# Patient Record
Sex: Male | Born: 1990 | Race: White | Hispanic: No | Marital: Married | Smoking: Former smoker
Health system: Southern US, Community
[De-identification: ages and names within clinical notes are randomized; demographics above are authoritative.]

## PROBLEM LIST (undated history)

## (undated) DIAGNOSIS — K219 Gastro-esophageal reflux disease without esophagitis: Secondary | ICD-10-CM

## (undated) DIAGNOSIS — B019 Varicella without complication: Secondary | ICD-10-CM

## (undated) HISTORY — DX: Varicella without complication: B01.9

## (undated) HISTORY — PX: NO PAST SURGERIES: SHX2092

## (undated) HISTORY — DX: Gastro-esophageal reflux disease without esophagitis: K21.9

---
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
## 2011-09-08 ENCOUNTER — Emergency Department (HOSPITAL_BASED_OUTPATIENT_CLINIC_OR_DEPARTMENT_OTHER)
Admission: EM | Admit: 2011-09-08 | Discharge: 2011-09-08 | Disposition: A | Discharge status: Home / Self Care | Payer: BC Managed Care – PPO | Attending: Emergency Medicine | Admitting: Emergency Medicine

## 2011-09-08 ENCOUNTER — Encounter: Payer: Self-pay | Admitting: *Deleted

## 2011-09-08 DIAGNOSIS — R21 Rash and other nonspecific skin eruption: Secondary | ICD-10-CM | POA: Insufficient documentation

## 2011-09-08 MED ORDER — PREDNISONE 20 MG PO TABS: 40.0000 mg | ORAL_TABLET | Freq: Every day | ORAL | Status: AC

## 2011-09-08 NOTE — ED Notes (Signed)
C/o rash to face, neck, and left eye since earlier today. No redness or hives noted at this time.

## 2011-09-08 NOTE — ED Provider Notes (Signed)
History     CSN: [id number] Arrival date & time: 09/08/2011  8:47 PM   First MD Initiated Contact with Patient 09/08/11 2100      Chief Complaint  Patient presents with  . Rash    (Consider location/radiation/quality/duration/timing/severity/associated sxs/prior treatment) HPI Comments: Pt states that he was cutting down trees and the symptoms started yesterday:pt states that he felt like his mouth was itchy so want to come in to be seen  Patient is a 20 y.o. male presenting with rash. The history is provided by the patient. No language interpreter was used.  Rash  This is a new problem. The current episode started 12 to 24 hours ago. The problem is associated with plant contact. There has been no fever. The rash is present on the face. The patient is experiencing no pain. Associated symptoms include itching. Pertinent negatives include no weeping. He has tried nothing for the symptoms.    History reviewed. No pertinent past medical history.  History reviewed. No pertinent past surgical history.  History reviewed. No pertinent family history.  History  Substance Use Topics  . Smoking status: Never Smoker   . Smokeless tobacco: Not on file  . Alcohol Use: No      Review of Systems  Constitutional: Negative.   Eyes: Negative.   Respiratory: Negative.   Cardiovascular: Negative.   Skin: Positive for itching and rash.  Neurological: Negative.     Allergies  Review of patient's allergies indicates no known allergies.  Home Medications  No current outpatient prescriptions on file.  BP 128/77  Pulse 81  Temp(Src) 98.5 F (36.9 C) (Oral)  Resp 16  Ht 5\' 8"  (1.727 m)  Wt 160 lb (72.576 kg)  BMI 24.33 kg/m2  SpO2 99%  Physical Exam  Nursing note and vitals reviewed. Constitutional: He is oriented to person, place, and time. He appears well-developed and well-nourished.  HENT:  Head: Normocephalic and atraumatic.  Right Ear: External ear normal.  Mouth/Throat:  Oropharynx is clear and moist.  Cardiovascular: Normal rate.   Pulmonary/Chest: Effort normal and breath sounds normal.  Neurological: He is alert and oriented to person, place, and time.  Skin:       Pt has a localized area of redness to the left forehead:no vesicles or drainage noted to the area    ED Course  Procedures (including critical care time)  Labs Reviewed - No data to display No results found.   1. Rash       MDM  Localized area of redness:will do a short course of steriods    Medical screening examination/treatment/procedure(s) were performed by non-physician practitioner and as supervising physician I was immediately available for consultation/collaboration. Osvaldo Human, M.D.    Teressa Lower, NP 09/08/11 2125  Carleene Cooper III, MD 09/09/11 [phone]

## 2012-01-28 ENCOUNTER — Emergency Department (INDEPENDENT_AMBULATORY_CARE_PROVIDER_SITE_OTHER): Payer: BC Managed Care – PPO

## 2012-01-28 ENCOUNTER — Encounter (HOSPITAL_BASED_OUTPATIENT_CLINIC_OR_DEPARTMENT_OTHER): Payer: Self-pay | Admitting: *Deleted

## 2012-01-28 ENCOUNTER — Emergency Department (HOSPITAL_BASED_OUTPATIENT_CLINIC_OR_DEPARTMENT_OTHER)
Admission: EM | Admit: 2012-01-28 | Discharge: 2012-01-28 | Disposition: A | Discharge status: Home / Self Care | Payer: BC Managed Care – PPO | Attending: Emergency Medicine | Admitting: Emergency Medicine

## 2012-01-28 DIAGNOSIS — R0602 Shortness of breath: Secondary | ICD-10-CM

## 2012-01-28 DIAGNOSIS — J45909 Unspecified asthma, uncomplicated: Secondary | ICD-10-CM | POA: Insufficient documentation

## 2012-01-28 DIAGNOSIS — J40 Bronchitis, not specified as acute or chronic: Secondary | ICD-10-CM | POA: Insufficient documentation

## 2012-01-28 MED ORDER — ALBUTEROL SULFATE HFA 108 (90 BASE) MCG/ACT IN AERS: 1.0000 | INHALATION_SPRAY | Freq: Four times a day (QID) | RESPIRATORY_TRACT | Status: DC | PRN

## 2012-01-28 MED ORDER — PREDNISONE 10 MG PO TABS: 20.0000 mg | ORAL_TABLET | Freq: Every day | ORAL | Status: DC

## 2012-01-28 NOTE — ED Notes (Signed)
Pt reports intermittent episodes of SOB- states "hard to get a full breath"- states was SOB last night and not feeling better today

## 2012-01-28 NOTE — ED Notes (Signed)
Patient called to say that his pharmacy was closed at this time and requested his prescriptions be called in to [facility] on [address] in [place].

## 2012-01-28 NOTE — ED Provider Notes (Signed)
History  This chart was scribed for Toy Baker, MD by Bennett Scrape. This patient was seen in room MH10/MH10 and the patient's care was started at 7:13PM.  CSN: [id number]  Arrival date & time 01/28/12  1755   First MD Initiated Contact with Patient 01/28/12 1911      Chief Complaint  Patient presents with  . Shortness of Breath    The history is provided by the patient. No language interpreter was used.    Logan Gardner is a 21 y.o. male with a h/o asthma who presents to the Emergency Department complaining of 24 hours of gradual onset, non-changing, intermittent SOB with occasional wheezing, shakiness and feeling sweaty. Pt describes the SOB as not being able to take a deep breath. He denies any modifying factors. He reports that he has been experiencing episodes of similar symptoms for months. He states that the SOB symptoms happen only at night and resolve on their own after an hour or two. Pt states that he came into this ED to be evaluated, because this episode has continued on since last night. He reports that he woke up this morning still not feeling well but continued onto work anyway. He states that he stopped at a [employer] after work when the symptoms became worse. He has not taken any medication PTA to improve symptoms. 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 He denies cough, chest and abdominal pain as associated symptoms. He is a former smoker (quit 01/21/12 after one year of use) and occasional alcohol user.  Past Medical History  Diagnosis Date  . Asthma     History reviewed. No pertinent past surgical history.  No family history on file.  History  Substance Use Topics  . Smoking status: Former Smoker    Quit date: 01/21/2012  . Smokeless tobacco: Current User    Types: Snuff  . Alcohol Use: No     rare      Review of Systems  Constitutional: Negative for fever and chills.  HENT:  Negative for congestion, sore throat and neck pain.   Eyes: Negative for pain.  Respiratory: Positive for shortness of breath and wheezing (Occasionally  ). Negative for cough.   Cardiovascular: Negative for chest pain.  Gastrointestinal: Negative for nausea, vomiting, abdominal pain and diarrhea.  Genitourinary: Negative for dysuria, urgency and hematuria.  Musculoskeletal: Negative for back pain.  Skin: Negative for rash.  Neurological: Negative for seizures and headaches.  Psychiatric/Behavioral: Negative for confusion.    Allergies  Review of patient's allergies indicates no known allergies.  Home Medications  No current outpatient prescriptions on file.  Triage Vitals: BP 137/65  Pulse 100  Temp(Src) 98.2 F (36.8 C) (Oral)  Resp 20  Ht 5\' 9"  (1.753 m)  Wt 155 lb (70.308 kg)  BMI 22.89 kg/m2  SpO2 100%  Physical Exam  Nursing note and vitals reviewed. Constitutional: He is oriented to person, place, and time. He appears well-developed and well-nourished.  HENT:  Head: Normocephalic and atraumatic.  Eyes: Conjunctivae and EOM are normal.  Neck: Normal range of motion. Neck supple.  Cardiovascular: Normal rate, regular rhythm and normal heart sounds.   Pulmonary/Chest: Effort normal and breath sounds normal. No respiratory distress. He has no wheezes. He has no rales.  Abdominal: Soft. There is no tenderness.  Musculoskeletal: Normal range of motion. He exhibits no edema.  Neurological: He is alert and oriented to person, place, and  time. No cranial nerve deficit.  Skin: Skin is warm and dry.  Psychiatric: He has a normal mood and affect. His behavior is normal.    ED Course  Procedures (including critical care time)  DIAGNOSTIC STUDIES: Oxygen Saturation is 100% on room air, normal by my interpretation.    COORDINATION OF CARE: 7:16PM-Discussed chest x-ray order and treatment plan with pt and pt agreed to plan.    Labs Reviewed - No data to display  Dg  Chest 2 View  01/28/2012  *RADIOLOGY REPORT*  Clinical Data: Short of breath.  Asthma.  CHEST - 2 VIEW  Comparison:  11/14/2008  Findings:  The heart size and mediastinal contours are within normal limits.  Both lungs are clear.  Pulmonary hyperinflation is seen, consistent with history of asthma/COPD.  The visualized skeletal structures are unremarkable.  IMPRESSION: Pulmonary hyperinflation, consistent with asthma/COPD.  No active disease.  Original Report Authenticated By: Danae Orleans, M.D.     No diagnosis found.    MDM  Patient to be treated for bronchitis and discharged. No concern for pulmonary embolism as patient has perked negative      I personally performed the services described in this documentation, which was scribed in my presence. The recorded information has been reviewed and considered.     Toy Baker, MD 01/28/12 2011

## 2012-01-28 NOTE — Discharge Instructions (Signed)
 Bronchitis Bronchitis is the body's way of reacting to injury and/or infection (inflammation) of the bronchi. Bronchi are the air tubes that extend from the windpipe into the lungs. If the inflammation becomes severe, it may cause shortness of breath. CAUSES  Inflammation may be caused by:  A virus.   Germs (bacteria).   Dust.   Allergens.   Pollutants and many other irritants.  The cells lining the bronchial tree are covered with tiny hairs (cilia). These constantly beat upward, away from the lungs, toward the mouth. This keeps the lungs free of pollutants. When these cells become too irritated and are unable to do their job, mucus begins to develop. This causes the characteristic cough of bronchitis. The cough clears the lungs when the cilia are unable to do their job. Without either of these protective mechanisms, the mucus would settle in the lungs. Then you would develop pneumonia. Smoking is a common cause of bronchitis and can contribute to pneumonia. Stopping this habit is the single most important thing you can do to help yourself. TREATMENT   Your caregiver may prescribe an antibiotic if the cough is caused by bacteria. Also, medicines that open up your airways make it easier to breathe. Your caregiver may also recommend or prescribe an expectorant. It will loosen the mucus to be coughed up. Only take over-the-counter or prescription medicines for pain, discomfort, or fever as directed by your caregiver.   Removing whatever causes the problem (smoking, for example) is critical to preventing the problem from getting worse.   Cough suppressants may be prescribed for relief of cough symptoms.   Inhaled medicines may be prescribed to help with symptoms now and to help prevent problems from returning.   For those with recurrent (chronic) bronchitis, there may be a need for steroid medicines.  SEEK IMMEDIATE MEDICAL CARE IF:   During treatment, you develop more pus-like mucus  (purulent sputum).   You have a fever.   Your baby is older than 3 months with a rectal temperature of 102 F (38.9 C) or higher.   Your baby is 51 months old or younger with a rectal temperature of 100.4 F (38 C) or higher.   You become progressively more ill.   You have increased difficulty breathing, wheezing, or shortness of breath.  It is necessary to seek immediate medical care if you are elderly or sick from any other disease. MAKE SURE YOU:   Understand these instructions.   Will watch your condition.   Will get help right away if you are not doing well or get worse.  Document Released: 10/24/2005 Document Revised: 10/13/2011 Document Reviewed: 09/02/2008 [facility] Patient Information 2012 [place], [place].

## 2013-04-28 ENCOUNTER — Emergency Department (HOSPITAL_BASED_OUTPATIENT_CLINIC_OR_DEPARTMENT_OTHER)
Admission: EM | Admit: 2013-04-28 | Discharge: 2013-04-29 | Disposition: A | Discharge status: Home / Self Care | Payer: BC Managed Care – PPO | Attending: Emergency Medicine | Admitting: Emergency Medicine

## 2013-04-28 ENCOUNTER — Emergency Department (HOSPITAL_BASED_OUTPATIENT_CLINIC_OR_DEPARTMENT_OTHER): Payer: BC Managed Care – PPO

## 2013-04-28 ENCOUNTER — Encounter (HOSPITAL_BASED_OUTPATIENT_CLINIC_OR_DEPARTMENT_OTHER): Payer: Self-pay | Admitting: *Deleted

## 2013-04-28 DIAGNOSIS — J45909 Unspecified asthma, uncomplicated: Secondary | ICD-10-CM | POA: Insufficient documentation

## 2013-04-28 DIAGNOSIS — Z87891 Personal history of nicotine dependence: Secondary | ICD-10-CM | POA: Insufficient documentation

## 2013-04-28 DIAGNOSIS — J3489 Other specified disorders of nose and nasal sinuses: Secondary | ICD-10-CM | POA: Insufficient documentation

## 2013-04-28 DIAGNOSIS — J40 Bronchitis, not specified as acute or chronic: Secondary | ICD-10-CM

## 2013-04-28 DIAGNOSIS — Z79899 Other long term (current) drug therapy: Secondary | ICD-10-CM | POA: Insufficient documentation

## 2013-04-28 NOTE — ED Notes (Addendum)
C/o prod cough that started yesterday. States green in color. Has had a low grader fever today. Last dose of tylenol at 330pm. States he worked outside with hay/ dust  yesterday and has felt worse since then. States his lungs "feel congested"  Also states he has some general weakness. C/o nausea as well. Denies vomiting. No wheezing noted on exam. Recent dz of asthma. Denies sob

## 2013-04-28 NOTE — ED Provider Notes (Signed)
History     CSN: [id number]  Arrival date & time 04/28/13  2125   None     Chief Complaint  Patient presents with  . Cough    (Consider location/radiation/quality/duration/timing/severity/associated sxs/prior treatment) Patient is a 22 y.o. male presenting with cough. The history is provided by the patient. No language interpreter was used.  Cough Cough characteristics:  Productive Sputum characteristics:  Nondescript Severity:  Moderate Onset quality:  Gradual Timing:  Constant Progression:  Worsening Chronicity:  New Smoker: no   Context: exposure to allergens   Relieved by:  Nothing Worsened by:  Nothing tried Ineffective treatments:  None tried Associated symptoms: rhinorrhea     Past Medical History  Diagnosis Date  . Asthma     History reviewed. No pertinent past surgical history.  No family history on file.  History  Substance Use Topics  . Smoking status: Former Smoker    Quit date: 01/21/2012  . Smokeless tobacco: Current User    Types: Snuff  . Alcohol Use: No     Comment: rare      Review of Systems  HENT: Positive for rhinorrhea.   Respiratory: Positive for cough.   All other systems reviewed and are negative.    Allergies  Review of patient's allergies indicates no known allergies.  Home Medications   Current Outpatient Rx  Name  Route  Sig  Dispense  Refill  . ALBUTEROL IN   Inhalation   Inhale into the lungs.         . predniSONE (DELTASONE) 10 MG tablet   Oral   Take 2 tablets (20 mg total) by mouth daily.   10 tablet   0     BP 129/79  Pulse 78  Temp(Src) 99.4 F (37.4 C) (Oral)  SpO2 98%  Physical Exam  Nursing note and vitals reviewed. Constitutional: He is oriented to person, place, and time. He appears well-developed and well-nourished.  HENT:  Head: Normocephalic and atraumatic.  Nose: Nose normal.  Mouth/Throat: Oropharynx is clear and moist.  Eyes: Conjunctivae and EOM are normal. Pupils are equal,  round, and reactive to light.  Neck: Normal range of motion. Neck supple.  Cardiovascular: Normal rate and normal heart sounds.   Pulmonary/Chest: Effort normal and breath sounds normal.  Abdominal: Soft.  Musculoskeletal: Normal range of motion.  Neurological: He is alert and oriented to person, place, and time. He has normal reflexes.  Skin: Skin is warm.  Psychiatric: He has a normal mood and affect.    ED Course  Procedures (including critical care time)  Labs Reviewed - No data to display No results found.   1. Bronchitis       MDM  zithromax  And albuterol   Return if any problems.          Lonia [place] Mc[place]ray, PA-C 04/29/13 0013

## 2013-04-29 MED ORDER — ALBUTEROL SULFATE HFA 108 (90 BASE) MCG/ACT IN AERS: 1.0000 | INHALATION_SPRAY | Freq: Four times a day (QID) | RESPIRATORY_TRACT | Status: DC | PRN

## 2013-04-29 MED ORDER — AZITHROMYCIN 100 MG/5ML PO SUSR: 100.0000 mg | Freq: Every day | ORAL | Status: AC

## 2013-04-29 NOTE — ED Provider Notes (Signed)
Medical screening examination/treatment/procedure(s) were performed by non-physician practitioner and as supervising physician I was immediately available for consultation/collaboration.   Hanley Seamen, MD 04/29/13 [phone]

## 2014-01-13 ENCOUNTER — Emergency Department (HOSPITAL_BASED_OUTPATIENT_CLINIC_OR_DEPARTMENT_OTHER)
Admission: EM | Admit: 2014-01-13 | Discharge: 2014-01-13 | Disposition: A | Discharge status: Home / Self Care | Payer: BC Managed Care – PPO | Attending: Emergency Medicine | Admitting: Emergency Medicine

## 2014-01-13 ENCOUNTER — Encounter (HOSPITAL_BASED_OUTPATIENT_CLINIC_OR_DEPARTMENT_OTHER): Payer: Self-pay | Admitting: Emergency Medicine

## 2014-01-13 ENCOUNTER — Emergency Department (HOSPITAL_BASED_OUTPATIENT_CLINIC_OR_DEPARTMENT_OTHER): Payer: BC Managed Care – PPO

## 2014-01-13 DIAGNOSIS — IMO0002 Reserved for concepts with insufficient information to code with codable children: Secondary | ICD-10-CM | POA: Insufficient documentation

## 2014-01-13 DIAGNOSIS — Z87891 Personal history of nicotine dependence: Secondary | ICD-10-CM | POA: Insufficient documentation

## 2014-01-13 DIAGNOSIS — Z79899 Other long term (current) drug therapy: Secondary | ICD-10-CM | POA: Insufficient documentation

## 2014-01-13 DIAGNOSIS — J3489 Other specified disorders of nose and nasal sinuses: Secondary | ICD-10-CM | POA: Insufficient documentation

## 2014-01-13 DIAGNOSIS — Z9109 Other allergy status, other than to drugs and biological substances: Secondary | ICD-10-CM

## 2014-01-13 DIAGNOSIS — J45909 Unspecified asthma, uncomplicated: Secondary | ICD-10-CM | POA: Insufficient documentation

## 2014-01-13 MED ORDER — CETIRIZINE HCL 10 MG PO TABS: 10.0000 mg | ORAL_TABLET | Freq: Every day | ORAL | Status: DC

## 2014-01-13 MED ORDER — ALBUTEROL SULFATE HFA 108 (90 BASE) MCG/ACT IN AERS: 1.0000 | INHALATION_SPRAY | Freq: Four times a day (QID) | RESPIRATORY_TRACT | Status: DC | PRN

## 2014-01-13 NOTE — Discharge Instructions (Signed)
Allergies ° Allergies may happen from anything your body is sensitive to. This may be food, medicines, pollens, chemicals, and many other things. Food allergies can be severe and deadly.  °HOME CARE °· If you do not know what causes a reaction, keep a diary. Write down the foods you ate and the symptoms that followed. Avoid foods that cause reactions. °· If you have red raised spots (hives) or a rash: °· Take medicine as told by your doctor. °· Use medicines for red raised spots and itching as needed. °· Apply cold cloths (compresses) to the skin. Take a cool bath. Avoid hot baths or showers. °· If you are severely allergic: °· It is often necessary to go to the hospital after you have treated your reaction. °· Wear your medical alert jewelry. °· You and your family must learn how to give a allergy shot or use an allergy kit (anaphylaxis kit). °· Always carry your allergy kit or shot with you. Use this medicine as told by your doctor if a severe reaction is occurring. °GET HELP RIGHT AWAY IF: °· You have trouble breathing or are making high-pitched whistling sounds (wheezing). °· You have a tight feeling in your chest or throat. °· You have a puffy (swollen) mouth. °· You have red raised spots, puffiness (swelling), or itching all over your body. °· You have had a severe reaction that was helped by your allergy kit or shot. The reaction can return once the medicine has worn off. °· You think you are having a food allergy. Symptoms most often happen within 30 minutes of eating a food. °· Your symptoms have not gone away within 2 days or are getting worse. °· You have new symptoms. °· You want to retest yourself with a food or drink you think causes an allergic reaction. Only do this under the care of a doctor. °MAKE SURE YOU:  °· Understand these instructions. °· Will watch your condition. °· Will get help right away if you are not doing well or get worse. °Document Released: 02/18/2013 Document Reviewed:  02/18/2013 °[employer]® Patient Information ©2014 [employer]. ° °

## 2014-01-13 NOTE — ED Notes (Signed)
Pt reports moving into a new house and using a lot of cleaning supplies and since then has been having difficulty breathing, shortness of breath and coughing.  Pt reports coughing keeping him up all night.

## 2014-01-13 NOTE — ED Provider Notes (Signed)
CSN: [id number]     Arrival date & time 01/13/14  2321 History  This chart was scribed for Harla Mensch Smitty CordsK Havish Petties-Rasch, MD by Dorothey Basemania Sutton, ED Scribe. This patient was seen in room MH12/MH12 and the patient's care was started at 11:32 PM.    Chief Complaint  Patient presents with  . Shortness of Breath   Patient is a 23 y.o. male presenting with shortness of breath. The history is provided by the patient. No language interpreter was used.  Shortness of Breath Severity:  Moderate Onset quality:  Gradual Timing:  Intermittent Progression:  Unchanged Chronicity:  Recurrent Context: fumes, occupational exposure and pollens   Relieved by:  None tried Worsened by:  Coughing Ineffective treatments:  None tried Associated symptoms: cough   Associated symptoms: no fever, no sore throat and no sputum production   Cough:    Cough characteristics:  Dry   Severity:  Moderate   Onset quality:  Gradual   Timing:  Intermittent   Progression:  Unchanged   Chronicity:  Recurrent Risk factors: no hx of cancer, no hx of PE/DVT and no obesity    HPI Comments: Logan Gardner is a 23 y.o. Male with a history of asthma who presents to the Emergency Department complaining of shortness of breath with an associated dry cough and chest congestion onset about a week ago that he states has been progressively worsening. He states that the shortness of breath is exacerbated with coughing. Patient reports that he has been using a lot of cleaning supplies in his house recently and that he works in [employer]. He denies taking any medications at home to treat his symptoms and states that he has not used his albuterol inhaler because it is one year old and it may be expired. Patient has no other pertinent medical history.   Past Medical History  Diagnosis Date  . Asthma    No past surgical history on file. No family history on file. History  Substance Use Topics  . Smoking status: Former Smoker    Quit date:  01/21/2012  . Smokeless tobacco: Current User    Types: Snuff  . Alcohol Use: No     Comment: rare    Review of Systems  Constitutional: Negative for fever.  HENT: Positive for congestion. Negative for sore throat.   Respiratory: Positive for cough. Negative for sputum production.   All other systems reviewed and are negative.   Allergies  Review of patient's allergies indicates no known allergies.  Home Medications   Current Outpatient Rx  Name  Route  Sig  Dispense  Refill  . EXPIRED: albuterol (PROVENTIL HFA;VENTOLIN HFA) 108 (90 BASE) MCG/ACT inhaler   Inhalation   Inhale 1-2 puffs into the lungs every 6 (six) hours as needed for wheezing.   1 Inhaler   0   . albuterol (PROVENTIL HFA;VENTOLIN HFA) 108 (90 BASE) MCG/ACT inhaler   Inhalation   Inhale 1-2 puffs into the lungs every 6 (six) hours as needed for wheezing.   1 Inhaler   0   . ALBUTEROL IN   Inhalation   Inhale into the lungs.         . predniSONE (DELTASONE) 10 MG tablet   Oral   Take 2 tablets (20 mg total) by mouth daily.   10 tablet   0    Triage Vitals: BP 134/62  Pulse 92  Temp(Src) 98.5 F (36.9 C) (Oral)  Resp 18  Ht 5\' 10"  (1.778 m)  Wt 160  lb (72.576 kg)  BMI 22.96 kg/m2  SpO2 98%  Physical Exam  Nursing note and vitals reviewed. Constitutional: He is oriented to person, place, and time. He appears well-developed and well-nourished. No distress.  HENT:  Head: Normocephalic and atraumatic.  Mouth/Throat: Oropharynx is clear and moist.  No swelling of lips, tongue, or uvula.   Eyes: Conjunctivae and EOM are normal. Pupils are equal, round, and reactive to light.  Neck: Normal range of motion. Neck supple. No tracheal deviation present.  Cardiovascular: Normal rate, regular rhythm and normal heart sounds.   Pulmonary/Chest: Effort normal and breath sounds normal. No stridor. No respiratory distress. He has no wheezes. He has no rales.  Abdominal: Soft. Bowel sounds are normal. He  exhibits no distension. There is no tenderness. There is no rebound and no guarding.  Musculoskeletal: Normal range of motion.  Neurological: He is alert and oriented to person, place, and time.  Skin: Skin is warm and dry.  Psychiatric: He has a normal mood and affect. His behavior is normal.    ED Course  Procedures (including critical care time)  DIAGNOSTIC STUDIES: Oxygen Saturation is 98% on room air, normal by my interpretation.    COORDINATION OF CARE: 11:34 PM- Discussed that symptoms are likely allergic in nature and advised patient to take Zyrtec daily and Mucinex at home. Will discharge patient with a refill of his inhaler. Will cancel the chest x-ray order based on physical examination findings. Discussed treatment plan with patient at bedside and patient verbalized agreement.     Labs Review Labs Reviewed - No data to display Imaging Review No results found.   EKG Interpretation None      MDM   Final diagnoses:  None   Discussed symptomatic care at length. Wells = 0, PERC = 0. Patient symptoms consistent with allergies with superimposed anxiety. Will refill patient's inhaler and discharge patient with a prescription for Zyrtec. Advised patient to follow up with the referral at the allergy center as needed. Patient and significant other agreeable to plan.     I personally performed the services described in this documentation, which was scribed in my presence. The recorded information has been reviewed and is accurate.      Jasmine Awe, MD 01/14/14 [phone]

## 2016-04-19 ENCOUNTER — Ambulatory Visit (INDEPENDENT_AMBULATORY_CARE_PROVIDER_SITE_OTHER): Payer: No Typology Code available for payment source | Admitting: Internal Medicine

## 2016-04-19 ENCOUNTER — Encounter: Payer: Self-pay | Admitting: Internal Medicine

## 2016-04-19 VITALS — BP 112/76 | HR 64 | Temp 98.7°F | Ht 68.0 in | Wt 154.0 lb

## 2016-04-19 DIAGNOSIS — K219 Gastro-esophageal reflux disease without esophagitis: Secondary | ICD-10-CM | POA: Diagnosis not present

## 2016-04-19 DIAGNOSIS — J302 Other seasonal allergic rhinitis: Secondary | ICD-10-CM | POA: Diagnosis not present

## 2016-04-19 DIAGNOSIS — J45909 Unspecified asthma, uncomplicated: Secondary | ICD-10-CM | POA: Diagnosis not present

## 2016-04-19 DIAGNOSIS — H6123 Impacted cerumen, bilateral: Secondary | ICD-10-CM

## 2016-04-19 DIAGNOSIS — H43399 Other vitreous opacities, unspecified eye: Secondary | ICD-10-CM

## 2016-04-19 NOTE — Progress Notes (Signed)
Pre visit review using our clinic review tool, if applicable. No additional management support is needed unless otherwise documented below in the visit note. 

## 2016-04-19 NOTE — Assessment & Plan Note (Signed)
Discussed avoiding foods that trigger reflux- handout given Go ahead and start Prilosec 20 mg OTC  Update me via mychart in 2 weeks

## 2016-04-19 NOTE — Assessment & Plan Note (Signed)
No issues as an adult Will monitor

## 2016-04-19 NOTE — Patient Instructions (Signed)
Food Choices for Gastroesophageal Reflux Disease, Adult When you have gastroesophageal reflux disease (GERD), the foods you eat and your eating habits are very important. Choosing the right foods can help ease the discomfort of GERD. WHAT GENERAL GUIDELINES DO I NEED TO FOLLOW?  Choose fruits, vegetables, whole grains, low-fat dairy products, and low-fat meat, fish, and poultry.  Limit fats such as oils, salad dressings, butter, nuts, and avocado.  Keep a food diary to identify foods that cause symptoms.  Avoid foods that cause reflux. These may be different for different people.  Eat frequent small meals instead of three large meals each day.  Eat your meals slowly, in a relaxed setting.  Limit fried foods.  Cook foods using methods other than frying.  Avoid drinking alcohol.  Avoid drinking large amounts of liquids with your meals.  Avoid bending over or lying down until 2-3 hours after eating. WHAT FOODS ARE NOT RECOMMENDED? The following are some foods and drinks that may worsen your symptoms: Vegetables Tomatoes. Tomato juice. Tomato and spaghetti sauce. Chili peppers. Onion and garlic. Horseradish. Fruits Oranges, grapefruit, and lemon (fruit and juice). Meats High-fat meats, fish, and poultry. This includes hot dogs, ribs, ham, sausage, salami, and bacon. Dairy Whole milk and chocolate milk. Sour cream. Cream. Butter. Ice cream. Cream cheese.  Beverages Coffee and tea, with or without caffeine. Carbonated beverages or energy drinks. Condiments Hot sauce. Barbecue sauce.  Sweets/Desserts Chocolate and cocoa. Donuts. Peppermint and spearmint. Fats and Oils High-fat foods, including French fries and potato chips. Other Vinegar. Strong spices, such as black pepper, white pepper, red pepper, cayenne, curry powder, cloves, ginger, and chili powder. The items listed above may not be a complete list of foods and beverages to avoid. Contact your dietitian for more  information.   This information is not intended to replace advice given to you by your health care provider. Make sure you discuss any questions you have with your health care provider.   Document Released: 10/24/2005 Document Revised: 11/14/2014 Document Reviewed: 08/28/2013 Elsevier Interactive Patient Education 2016 [employer] Inc[employer]  

## 2016-04-19 NOTE — Progress Notes (Signed)
HPI  Pt presents to the clinic today to establish care and for management of the conditions listed below. He has not had a PCP in many years.   Hx of childhood asthma: It has not bothered him as an adult. He recently quit smoking, 3 years ago. He does have an inhaler in case he needs it.   GERD: This has been occuring > 3 x week for the last 3 months. He is not sure what triggers it. He bought Omeprazole but he has not tried it yet.  He also c/o headache, left ear pressure and nasal congestion. This usually occurs in the spring. He denies fever, chills or body aches. He does not take anything OTC for this.   He also reports seeing floaters. This occurs intermittently. He denies dizziness, blurred vision, double vision. He has not had his eyes checked in a while.   Flu: never Tetanus: 09/2005 Dentist: as needed  Past Medical History  Diagnosis Date  . Asthma   . Chicken pox   . GERD (gastroesophageal reflux disease)     No current outpatient prescriptions on file.   No current facility-administered medications for this visit.    Allergies  Allergen Reactions  . Toradol [Ketorolac Tromethamine] Other (See Comments)    Dizziness, syncope symptoms    Family History  Problem Relation Age of Onset  . Other Mother     pt reports mom has health problems but not sure diagnosis  . Arthritis Maternal Grandmother     Social History   Social History  . Marital Status: Married    Spouse Name: N/A  . Number of Children: N/A  . Years of Education: N/A   Occupational History  . Not on file.   Social History Main Topics  . Smoking status: Former Smoker    Quit date: 01/21/2012  . Smokeless tobacco: Former NeurosurgeonUser  . Alcohol Use: 0.0 oz/week    0 Standard drinks or equivalent per week     Comment: occasional  . Drug Use: No  . Sexual Activity: Not on file   Other Topics Concern  . Not on file   Social History Narrative    ROS:  Constitutional: Pt reports headache.  Denies fever, malaise, fatigue, or abrupt weight changes.  HEENT: Pt reports ear pressure, nasal congestion and floaters. Denies eye pain, eye redness, ear pain, ringing in the ears, wax buildup, runny nose, bloody nose, or sore throat. Respiratory: Denies difficulty breathing, shortness of breath, cough or sputum production.   Cardiovascular: Denies chest pain, chest tightness, palpitations or swelling in the hands or feet.  Gastrointestinal: Pt reports reflux. Denies abdominal pain, bloating, constipation, diarrhea or blood in the stool.  GU: Denies frequency, urgency, pain with urination, blood in urine, odor or discharge. Musculoskeletal: Denies decrease in range of motion, difficulty with gait, muscle pain or joint pain and swelling.  Skin: Denies redness, rashes, lesions or ulcercations.  Neurological: Denies dizziness, difficulty with memory, difficulty with speech or problems with balance and coordination.  Psych: Denies anxiety, depression, SI/HI.  No other specific complaints in a complete review of systems (except as listed in HPI above).  PE:  BP 112/76 mmHg  Pulse 64  Temp(Src) 98.7 F (37.1 C) (Oral)  Ht 5\' 8"  (1.727 m)  Wt 154 lb (69.854 kg)  BMI 23.42 kg/m2  SpO2 98% Wt Readings from Last 3 Encounters:  04/19/16 154 lb (69.854 kg)  01/13/14 160 lb (72.576 kg)  01/28/12 155 lb (70.308 kg)  General: Appears his stated age, well developed, well nourished in NAD. Skin: Dry and intact. HEENT: Head: normal shape and size; Eyes: sclera white, no icterus, conjunctiva pink, PERRLA and EOMs intact; Ears: bilateral cerumen impaction; Throat/Mouth: Teeth present, mucosa pink and moist, no lesions or ulcerations noted.  Cardiovascular: Normal rate and rhythm. S1,S2 noted.  No murmur, rubs or gallops noted.  Pulmonary/Chest: Normal effort and positive vesicular breath sounds. No respiratory distress. No wheezes, rales or ronchi noted.  Abdomen: Soft and nontender. Normal bowel  sounds. No distention or masses noted.  Neurological: Alert and oriented.  Psychiatric: Mood and affect normal. Behavior is normal. Judgment and thought content normal.    Assessment and Plan:  Floaters:  Advised him to make an appt for an eye exam  Seasonal Allergies:  Try Flonase OTC  Bilateral Cerumen Impaction:  Manual lavage by CMA Discussed using Debrox drops OTC 2 x week to prevent wax buildup   Make an appt for your annual exam

## 2016-04-29 ENCOUNTER — Ambulatory Visit (INDEPENDENT_AMBULATORY_CARE_PROVIDER_SITE_OTHER): Payer: No Typology Code available for payment source | Admitting: Internal Medicine

## 2016-04-29 ENCOUNTER — Encounter: Payer: Self-pay | Admitting: Internal Medicine

## 2016-04-29 VITALS — BP 110/68 | HR 87 | Temp 98.3°F | Wt 153.0 lb

## 2016-04-29 DIAGNOSIS — R1013 Epigastric pain: Secondary | ICD-10-CM | POA: Diagnosis not present

## 2016-04-29 DIAGNOSIS — R110 Nausea: Secondary | ICD-10-CM | POA: Diagnosis not present

## 2016-04-29 DIAGNOSIS — K219 Gastro-esophageal reflux disease without esophagitis: Secondary | ICD-10-CM | POA: Diagnosis not present

## 2016-04-29 LAB — CBC
HEMATOCRIT: 44.4 % (ref 38.5–50.0)
Hemoglobin: 15.3 g/dL (ref 13.2–17.1)
MCH: 30.8 pg (ref 27.0–33.0)
MCHC: 34.5 g/dL (ref 32.0–36.0)
MCV: 89.5 fL (ref 80.0–100.0)
MPV: 10.2 fL (ref 7.5–12.5)
PLATELETS: 200 10*3/uL (ref 140–400)
RBC: 4.96 MIL/uL (ref 4.20–5.80)
RDW: 12.9 % (ref 11.0–15.0)
WBC: 7.6 10*3/uL (ref 3.8–10.8)

## 2016-04-29 NOTE — Progress Notes (Signed)
Pre visit review using our clinic review tool, if applicable. No additional management support is needed unless otherwise documented below in the visit note. 

## 2016-04-29 NOTE — Patient Instructions (Signed)
Food Choices for Gastroesophageal Reflux Disease, Adult When you have gastroesophageal reflux disease (GERD), the foods you eat and your eating habits are very important. Choosing the right foods can help ease the discomfort of GERD. WHAT GENERAL GUIDELINES DO I NEED TO FOLLOW?  Choose fruits, vegetables, whole grains, low-fat dairy products, and low-fat meat, fish, and poultry.  Limit fats such as oils, salad dressings, butter, nuts, and avocado.  Keep a food diary to identify foods that cause symptoms.  Avoid foods that cause reflux. These may be different for different people.  Eat frequent small meals instead of three large meals each day.  Eat your meals slowly, in a relaxed setting.  Limit fried foods.  Cook foods using methods other than frying.  Avoid drinking alcohol.  Avoid drinking large amounts of liquids with your meals.  Avoid bending over or lying down until 2-3 hours after eating. WHAT FOODS ARE NOT RECOMMENDED? The following are some foods and drinks that may worsen your symptoms: Vegetables Tomatoes. Tomato juice. Tomato and spaghetti sauce. Chili peppers. Onion and garlic. Horseradish. Fruits Oranges, grapefruit, and lemon (fruit and juice). Meats High-fat meats, fish, and poultry. This includes hot dogs, ribs, ham, sausage, salami, and bacon. Dairy Whole milk and chocolate milk. Sour cream. Cream. Butter. Ice cream. Cream cheese.  Beverages Coffee and tea, with or without caffeine. Carbonated beverages or energy drinks. Condiments Hot sauce. Barbecue sauce.  Sweets/Desserts Chocolate and cocoa. Donuts. Peppermint and spearmint. Fats and Oils High-fat foods, including French fries and potato chips. Other Vinegar. Strong spices, such as black pepper, white pepper, red pepper, cayenne, curry powder, cloves, ginger, and chili powder. The items listed above may not be a complete list of foods and beverages to avoid. Contact your dietitian for more  information.   This information is not intended to replace advice given to you by your health care provider. Make sure you discuss any questions you have with your health care provider.   Document Released: 10/24/2005 Document Revised: 11/14/2014 Document Reviewed: 08/28/2013 Elsevier Interactive Patient Education 2016 [employer] Inc[employer]  

## 2016-04-29 NOTE — Addendum Note (Signed)
Addended by: Baldomero LamyHAVERS, NATASHA C on: 04/29/2016 04:01 PM   Modules accepted: Orders

## 2016-04-29 NOTE — Progress Notes (Signed)
Subjective:    Patient ID: Logan Gardner, male    DOB: 12/21/1990, 25 y.o.   MRN: [id number]  HPI  Pt presents to the clinic today to follow up heartburn. This has been going on 3-4 months now. He is not sure what triggers. He took Animal nutritionistrolosec OTC for 10 days. He reports it seems to help in the morning but by the afternoon, he has increased gas and bloating. He has right side chest/upper abdominal pain that radiates to the right side of his back. He has associated nausea, but denies vomiting, diarrhea or constipation.The pain improves if he burps. He denies changes in diet and medication. He is stressed but denies anxiety.  Review of Systems      Past Medical History  Diagnosis Date  . Asthma   . Chicken pox   . GERD (gastroesophageal reflux disease)     No current outpatient prescriptions on file.   No current facility-administered medications for this visit.    Allergies  Allergen Reactions  . Toradol [Ketorolac Tromethamine] Other (See Comments)    Dizziness, syncope symptoms    Family History  Problem Relation Age of Onset  . Other Mother     pt reports mom has health problems but not sure diagnosis  . Arthritis Maternal Grandmother   . Heart disease Father   . Cancer Neg Hx   . Diabetes Neg Hx     Social History   Social History  . Marital Status: Married    Spouse Name: N/A  . Number of Children: N/A  . Years of Education: N/A   Occupational History  . Not on file.   Social History Main Topics  . Smoking status: Former Smoker    Quit date: 01/21/2012  . Smokeless tobacco: Former NeurosurgeonUser  . Alcohol Use: 0.0 oz/week    0 Standard drinks or equivalent per week     Comment: occasional  . Drug Use: No  . Sexual Activity: Yes   Other Topics Concern  . Not on file   Social History Narrative     Constitutional: Denies fever, malaise, fatigue, headache or abrupt weight changes.  Respiratory: Denies difficulty breathing, shortness of breath, cough or  sputum production.   Cardiovascular: Denies chest pain, chest tightness, palpitations or swelling in the hands or feet.  Gastrointestinal: Pt reports reflux. Denies abdominal pain, bloating, constipation, diarrhea or blood in the stool.  Psych: Denies anxiety, depression, SI/HI.  No other specific complaints in a complete review of systems (except as listed in HPI above).  Objective:   Physical Exam   BP 110/68 mmHg  Pulse 87  Temp(Src) 98.3 F (36.8 C) (Oral)  Wt 153 lb (69.4 kg)  SpO2 98% Wt Readings from Last 3 Encounters:  04/29/16 153 lb (69.4 kg)  04/19/16 154 lb (69.854 kg)  01/13/14 160 lb (72.576 kg)    General: Appears his stated age, well developed, well nourished in NAD. Cardiovascular: Normal rate and rhythm. S1,S2 noted.  No murmur, rubs or gallops noted. . Pulmonary/Chest: Normal effort and positive vesicular breath sounds. No respiratory distress. No wheezes, rales or ronchi noted.  Abdomen: Soft and mildly tender in the epigastric region. Normal bowel sounds, no bruits noted. No distention or masses noted. Liver, spleen and kidneys non palpable. Neurological: Alert and oriented.  Psychiatric: He is mildly anxious appearing today.       Assessment & Plan:   Epigastric pain, nausea, GERD:  Stop Prilosec if it makes you worse Will check  CBC, CMET, Amylase, Lipase and H Pylori today Can try Maalox or Gas X to see if it helps He declines RX for Zofran at this time  Will follow up after labs are back, RTC as needed Nicki ReaperBAITY, REGINA, NP

## 2016-04-30 LAB — COMPREHENSIVE METABOLIC PANEL
ALT: 14 U/L (ref 9–46)
AST: 12 U/L (ref 10–40)
Albumin: 4.5 g/dL (ref 3.6–5.1)
Alkaline Phosphatase: 56 U/L (ref 40–115)
BILIRUBIN TOTAL: 0.7 mg/dL (ref 0.2–1.2)
BUN: 14 mg/dL (ref 7–25)
CALCIUM: 9.6 mg/dL (ref 8.6–10.3)
CO2: 30 mmol/L (ref 20–31)
Chloride: 103 mmol/L (ref 98–110)
Creat: 1.12 mg/dL (ref 0.60–1.35)
GLUCOSE: 76 mg/dL (ref 65–99)
POTASSIUM: 4.8 mmol/L (ref 3.5–5.3)
Sodium: 141 mmol/L (ref 135–146)
Total Protein: 6.7 g/dL (ref 6.1–8.1)

## 2016-04-30 LAB — AMYLASE: Amylase: 38 U/L (ref 0–105)

## 2016-04-30 LAB — LIPASE: Lipase: 6 U/L — ABNORMAL LOW (ref 7–60)

## 2016-05-03 NOTE — Addendum Note (Signed)
Addended by: Josph MachoANCE, Ainslee Sou A on: 05/03/2016 09:23 AM   Modules accepted: Orders

## 2016-05-04 LAB — HELICOBACTER PYLORI  SPECIAL ANTIGEN: H. PYLORI ANTIGEN STOOL: NOT DETECTED

## 2016-05-05 ENCOUNTER — Other Ambulatory Visit: Payer: Self-pay | Admitting: Internal Medicine

## 2016-05-05 ENCOUNTER — Encounter: Payer: Self-pay | Admitting: Gastroenterology

## 2016-05-05 DIAGNOSIS — R1013 Epigastric pain: Secondary | ICD-10-CM

## 2016-05-05 DIAGNOSIS — R140 Abdominal distension (gaseous): Secondary | ICD-10-CM

## 2016-07-12 ENCOUNTER — Ambulatory Visit: Payer: No Typology Code available for payment source | Admitting: Gastroenterology

## 2017-08-22 ENCOUNTER — Telehealth: Payer: Self-pay | Admitting: Internal Medicine

## 2017-08-22 ENCOUNTER — Ambulatory Visit (INDEPENDENT_AMBULATORY_CARE_PROVIDER_SITE_OTHER): Payer: No Typology Code available for payment source | Admitting: Internal Medicine

## 2017-08-22 ENCOUNTER — Encounter: Payer: Self-pay | Admitting: Internal Medicine

## 2017-08-22 ENCOUNTER — Ambulatory Visit (INDEPENDENT_AMBULATORY_CARE_PROVIDER_SITE_OTHER)
Admission: RE | Admit: 2017-08-22 | Discharge: 2017-08-22 | Disposition: A | Discharge status: Home / Self Care | Payer: No Typology Code available for payment source | Source: Ambulatory Visit | Attending: Internal Medicine | Admitting: Internal Medicine

## 2017-08-22 VITALS — BP 110/78 | HR 77 | Temp 98.8°F | Ht 68.0 in | Wt 166.0 lb

## 2017-08-22 DIAGNOSIS — Z23 Encounter for immunization: Secondary | ICD-10-CM | POA: Diagnosis not present

## 2017-08-22 DIAGNOSIS — K219 Gastro-esophageal reflux disease without esophagitis: Secondary | ICD-10-CM | POA: Diagnosis not present

## 2017-08-22 DIAGNOSIS — Z0001 Encounter for general adult medical examination with abnormal findings: Secondary | ICD-10-CM

## 2017-08-22 DIAGNOSIS — M542 Cervicalgia: Secondary | ICD-10-CM | POA: Diagnosis not present

## 2017-08-22 LAB — COMPREHENSIVE METABOLIC PANEL
ALBUMIN: 4.6 g/dL (ref 3.5–5.2)
ALK PHOS: 62 U/L (ref 39–117)
ALT: 23 U/L (ref 0–53)
AST: 16 U/L (ref 0–37)
BILIRUBIN TOTAL: 0.5 mg/dL (ref 0.2–1.2)
BUN: 19 mg/dL (ref 6–23)
CALCIUM: 9.8 mg/dL (ref 8.4–10.5)
CHLORIDE: 104 meq/L (ref 96–112)
CO2: 27 mEq/L (ref 19–32)
CREATININE: 0.9 mg/dL (ref 0.40–1.50)
GFR: 108.17 mL/min (ref >60.00)
Glucose, Bld: 99 mg/dL (ref 70–99)
Potassium: 4.6 mEq/L (ref 3.5–5.1)
Sodium: 140 mEq/L (ref 135–145)
TOTAL PROTEIN: 7.1 g/dL (ref 6.0–8.3)

## 2017-08-22 LAB — LIPID PANEL
Cholesterol: 127 mg/dL (ref 0–200)
HDL: 34.7 mg/dL — AB (ref >39.00)
LDL Cholesterol: 83 mg/dL (ref 0–99)
NonHDL: 92.07
TRIGLYCERIDES: 44 mg/dL (ref 0.0–149.0)
Total CHOL/HDL Ratio: 4
VLDL: 8.8 mg/dL (ref 0.0–40.0)

## 2017-08-22 LAB — CBC
HCT: 46.1 % (ref 39.0–52.0)
Hemoglobin: 15.8 g/dL (ref 13.0–17.0)
MCHC: 34.3 g/dL (ref 30.0–36.0)
MCV: 91.1 fl (ref 78.0–100.0)
PLATELETS: 202 10*3/uL (ref 150.0–400.0)
RBC: 5.06 Mil/uL (ref 4.22–5.81)
RDW: 12.9 % (ref 11.5–15.5)
WBC: 6.8 10*3/uL (ref 4.0–10.5)

## 2017-08-22 NOTE — Telephone Encounter (Signed)
Ok. Have him start taking Zantac OTC 150 mg PO QHS. Update me in 1 month if no improvement.

## 2017-08-22 NOTE — Addendum Note (Signed)
Addended by: Roena Malady on: 08/22/2017 11:28 AM   Modules accepted: Orders

## 2017-08-22 NOTE — Telephone Encounter (Signed)
Best number [phone]  Pt stated med he was taking omeprazole   Generic

## 2017-08-22 NOTE — Assessment & Plan Note (Signed)
Advised him to let me know which acid reducer he is taking  Consider Zantac vs Prilosec depending on what he has been taking
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
## 2017-08-22 NOTE — Patient Instructions (Signed)

## 2017-08-23 NOTE — Telephone Encounter (Signed)
Pt is aware as instructed and expressed understanding 

## 2018-10-19 ENCOUNTER — Other Ambulatory Visit: Payer: Self-pay

## 2018-10-19 ENCOUNTER — Emergency Department (HOSPITAL_BASED_OUTPATIENT_CLINIC_OR_DEPARTMENT_OTHER): Payer: No Typology Code available for payment source

## 2018-10-19 ENCOUNTER — Emergency Department (HOSPITAL_BASED_OUTPATIENT_CLINIC_OR_DEPARTMENT_OTHER)
Admission: EM | Admit: 2018-10-19 | Discharge: 2018-10-19 | Disposition: A | Discharge status: Home / Self Care | Payer: No Typology Code available for payment source | Attending: Emergency Medicine | Admitting: Emergency Medicine

## 2018-10-19 ENCOUNTER — Encounter (HOSPITAL_BASED_OUTPATIENT_CLINIC_OR_DEPARTMENT_OTHER): Payer: Self-pay | Admitting: *Deleted

## 2018-10-19 DIAGNOSIS — J45909 Unspecified asthma, uncomplicated: Secondary | ICD-10-CM | POA: Diagnosis not present

## 2018-10-19 DIAGNOSIS — S61211A Laceration without foreign body of left index finger without damage to nail, initial encounter: Secondary | ICD-10-CM | POA: Diagnosis present

## 2018-10-19 DIAGNOSIS — W312XXA Contact with powered woodworking and forming machines, initial encounter: Secondary | ICD-10-CM | POA: Diagnosis not present

## 2018-10-19 DIAGNOSIS — Y990 Civilian activity done for income or pay: Secondary | ICD-10-CM | POA: Diagnosis not present

## 2018-10-19 DIAGNOSIS — Y929 Unspecified place or not applicable: Secondary | ICD-10-CM | POA: Insufficient documentation

## 2018-10-19 DIAGNOSIS — Y9389 Activity, other specified: Secondary | ICD-10-CM | POA: Diagnosis not present

## 2018-10-19 LAB — RAPID URINE DRUG SCREEN, HOSP PERFORMED
AMPHETAMINES: NOT DETECTED
BARBITURATES: NOT DETECTED
BENZODIAZEPINES: NOT DETECTED
Cocaine: NOT DETECTED
Opiates: NOT DETECTED
Tetrahydrocannabinol: NOT DETECTED

## 2018-10-19 MED ORDER — LIDOCAINE HCL (PF) 1 % IJ SOLN
5.0000 mL | Freq: Once | INTRAMUSCULAR | Status: AC
  Administered 2018-10-19: 5 mL
  Filled 2018-10-19: qty 5

## 2018-10-19 NOTE — ED Notes (Signed)
ED Provider at bedside. 

## 2018-10-19 NOTE — Discharge Instructions (Addendum)
Please read instructions below.  Keep your wound clean and covered. In 24 hours, you can get your wound wet; gently clean it with soap and water, pat it dry, and reapply a clean bandage. You can take ibuprofen/advil every 6 hours as needed for pain Follow up with your primary care or urgent care for wound recheck and suture removal in 7 days.  Return to the ER for fever, pus draining from wound, redness, or new or worsening symptoms.

## 2018-10-19 NOTE — ED Triage Notes (Signed)
Laceration to his left index finger with a sawzall at work today. Bleeding controlled. UDS needed for workman's comp.

## 2018-10-19 NOTE — ED Provider Notes (Signed)
[facility] EMERGENCY DEPARTMENT Provider Note   CSN: [id number] Arrival date & time: 10/19/18  1458     History   Chief Complaint Chief Complaint  Patient presents with  . Laceration    HPI Logan Gardner is a 27 y.o. male with past medical history of asthma, GERD, presenting to the emergency department with acute onset of laceration to left index finger that occurred at work today.  He states he was using a sawzall and his finger slipped.  He cut the dorsum of his index finger which caused pain that was throbbing and shooting up into his arm.  Currently his pain is 1/10 severity.  He states his distal finger looked purple initially, however that resolved.  He applied pressure with much improvement in the bleeding.  He denies numbness.  He is right-hand dominant.  Last tetanus he believes was 2 to 3 years ago.  No immunocompromise.  The history is provided by the patient.    Past Medical History:  Diagnosis Date  . Asthma   . Chicken pox   . GERD (gastroesophageal reflux disease)     Patient Active Problem List   Diagnosis Date Noted  . Esophageal reflux 04/19/2016  . Childhood asthma 04/19/2016    History reviewed. No pertinent surgical history.      Home Medications    Prior to Admission medications   Not on File    Family History Family History  Problem Relation Age of Onset  . Other Mother        pt reports mom has health problems but not sure diagnosis  . Heart disease Father   . Arthritis Maternal Grandmother   . Cancer Neg Hx   . Diabetes Neg Hx     Social History Social History   Tobacco Use  . Smoking status: Former Smoker    Last attempt to quit: 01/21/2012    Years since quitting: 6.7  . Smokeless tobacco: Former Engineer, water Use Topics  . Alcohol use: Yes    Alcohol/week: 0.0 standard drinks    Comment: occasional  . Drug use: No     Allergies   Toradol [ketorolac tromethamine]   Review of Systems Review of  Systems  Skin: Positive for color change and wound.  Allergic/Immunologic: Negative for immunocompromised state.  Neurological: Negative for numbness.     Physical Exam Updated Vital Signs BP (!) 146/86 (BP Location: Right Arm)   Pulse 76   Temp 98.5 F (36.9 C) (Oral)   Resp 18   Ht 5\' 9"  (1.753 m)   Wt 77.1 kg   SpO2 99%   BMI 25.10 kg/m   Physical Exam Vitals signs and nursing note reviewed.  Constitutional:      Appearance: He is well-developed.  HENT:     Head: Normocephalic and atraumatic.  Eyes:     Conjunctiva/sclera: Conjunctivae normal.  Cardiovascular:     Rate and Rhythm: Normal rate.     Pulses: Normal pulses.  Pulmonary:     Effort: Pulmonary effort is normal.  Musculoskeletal:     Comments: Dorsum of left proximal phalanx of 2nd digit with 1.5 cm horizontal jagged laceration.  Not grossly contaminated.  no Tendons visualized at the base of the wound. Preserved extension against resistance of proximal and distal interphalangeal joints.  Normal distal sensation.  Brisk capillary refill.  Neurological:     Mental Status: He is alert.  Psychiatric:        Behavior: Behavior normal.  ED Treatments / Results  Labs (all labs ordered are listed, but only abnormal results are displayed) Labs Reviewed  RAPID URINE DRUG SCREEN, HOSP PERFORMED    EKG None  Radiology Dg Finger Index Left  Result Date: 10/19/2018 CLINICAL DATA:  Laceration to left index finger. EXAM: LEFT INDEX FINGER 2+V COMPARISON:  None. FINDINGS: No discrete radiopaque foreign body noted. No bony abnormalities. No fractures. IMPRESSION: Negative. Electronically Signed   By: Gerome Sam III M.D   On: 10/19/2018 15:42    Procedures .Marland KitchenLaceration Repair Date/Time: 10/19/2018 5:06 PM Performed by: Robinson, [place] N, PA-C Authorized by: Robinson, [place] N, PA-C   Consent:    Consent obtained:  Verbal   Consent given by:  Patient   Risks discussed:  Infection and poor  cosmetic result   Alternatives discussed:  No treatment Anesthesia (see MAR for exact dosages):    Anesthesia method:  Nerve block   Block needle gauge:  25 G   Block anesthetic:  Lidocaine 1% w/o epi   Block injection procedure:  Anatomic landmarks palpated   Block outcome:  Anesthesia achieved Laceration details:    Location:  Finger   Finger location:  L index finger   Length (cm):  1.5 Repair type:    Repair type:  Simple Pre-procedure details:    Preparation:  Patient was prepped and draped in usual sterile fashion Exploration:    Hemostasis achieved with:  Direct pressure   Wound exploration: wound explored through full range of motion and entire depth of wound probed and visualized     Wound extent: no foreign bodies/material noted, no tendon damage noted and no underlying fracture noted     Contaminated: no   Treatment:    Area cleansed with:  Saline   Irrigation method:  Pressure wash   Visualized foreign bodies/material removed: no   Skin repair:    Repair method:  Sutures   Suture size:  5-0   Suture material:  Prolene   Suture technique:  Simple interrupted   Number of sutures:  3 Approximation:    Approximation:  Close Post-procedure details:    Dressing:  Non-adherent dressing   Patient tolerance of procedure:  Tolerated well, no immediate complications Comments:     Nerve block performed by myself. Sutures placed by Jackelyn Poling, PA Student. 3 simple interrupted sutures placed. Wound well-approximated.  I was present during entirety of procedure.    (including critical care time)  Medications Ordered in ED Medications  lidocaine (PF) (XYLOCAINE) 1 % injection 5 mL (has no administration in time range)     Initial Impression / Assessment and Plan / ED Course  I have reviewed the triage vital signs and the nursing notes.  Pertinent labs & imaging results that were available during my care of the patient were reviewed by me and considered in my  medical decision making (see chart for details).     Pt with laceration to dorsum of left index finger that occurred while using a sawzall at work today. Preserved active and resistive ROM. Wound explored and base of wound visualized in a bloodless field without evidence of foreign body or tendon damage. Laceration occurred < 8 hours prior to repair which was well tolerated.  Tdap up-to-date. Xray  Negative for retained foreign body or fracture.  Pt has  no comorbidities to effect normal wound healing. Pt discharged  without antibiotics.  Discussed suture home care with patient and answered questions. Pt to follow-up for wound check and  suture removal in 7 days; they are to return to the ED sooner for signs of infection. Pt is hemodynamically stable with no complaints prior to dc.   Discussed results, findings, treatment and follow up. Patient advised of return precautions. Patient verbalized understanding and agreed with plan.  Final Clinical Impressions(s) / ED Diagnoses   Final diagnoses:  Laceration of left index finger without foreign body without damage to nail, initial encounter    ED Discharge Orders    None       Robinson, [place] N, PA-C 10/19/18 1711    Sabas SousBero, Michael M, MD 10/19/18 [phone]

## 2019-07-04 ENCOUNTER — Ambulatory Visit (HOSPITAL_COMMUNITY)
Admission: EM | Admit: 2019-07-04 | Discharge: 2019-07-04 | Disposition: A | Discharge status: Home / Self Care | Payer: No Typology Code available for payment source | Attending: Urgent Care | Admitting: Urgent Care

## 2019-07-04 ENCOUNTER — Encounter (HOSPITAL_COMMUNITY): Payer: Self-pay

## 2019-07-04 ENCOUNTER — Other Ambulatory Visit: Payer: Self-pay

## 2019-07-04 DIAGNOSIS — J4531 Mild persistent asthma with (acute) exacerbation: Secondary | ICD-10-CM

## 2019-07-04 DIAGNOSIS — L259 Unspecified contact dermatitis, unspecified cause: Secondary | ICD-10-CM

## 2019-07-04 DIAGNOSIS — R0602 Shortness of breath: Secondary | ICD-10-CM | POA: Diagnosis not present

## 2019-07-04 DIAGNOSIS — R062 Wheezing: Secondary | ICD-10-CM

## 2019-07-04 MED ORDER — ALBUTEROL SULFATE HFA 108 (90 BASE) MCG/ACT IN AERS: 1.0000 | INHALATION_SPRAY | Freq: Four times a day (QID) | RESPIRATORY_TRACT | 0 refills | Status: DC | PRN

## 2019-07-04 MED ORDER — PREDNISONE 20 MG PO TABS: ORAL_TABLET | ORAL | 0 refills | Status: DC

## 2019-07-04 MED ORDER — CETIRIZINE HCL 10 MG PO TABS: 10.0000 mg | ORAL_TABLET | Freq: Every day | ORAL | 0 refills | Status: DC

## 2019-07-04 MED ORDER — MONTELUKAST SODIUM 10 MG PO TABS: 10.0000 mg | ORAL_TABLET | Freq: Every day | ORAL | 0 refills | Status: DC

## 2019-07-04 NOTE — ED Triage Notes (Signed)
Pt presents  With fatigue, chest tightness, and shortness of breath X 1 week.  Pt has Hx of asthma and stopped using inhaler a few months ago

## 2019-07-04 NOTE — ED Provider Notes (Signed)
MRN: [id number] DOB: 11-11-1990  Subjective:   Logan Gardner is a 28 y.o. male presenting for 1 to 2-week history of persistent daily shortness of breath, wheezing and chest tightness.  Has a history of asthma but has not needed to use an albuterol inhaler for several months.  However, he is also dealt with itchy rash regularly throughout his life and especially in the past year.  Reports that he thinks he keeps getting a poison oak rash.  He does work as a Development worker, community, is in crawl spaces regularly.  He also does yard work and this is where he thinks he gets in contact with poisonous plants such as poison oak.  Has not tried any medications for relief but admits that the fact that he has had the itchy rash with a few black spots on his right lower leg with worsening fatigue in the past week prompted his visit today.  Denies smoking cigarettes.  He is not currently taking any medications.   Allergies  Allergen Reactions  . Toradol [Ketorolac Tromethamine] Other (See Comments)    Dizziness, syncope symptoms    Past Medical History:  Diagnosis Date  . Asthma   . Chicken pox   . GERD (gastroesophageal reflux disease)      History reviewed. No pertinent surgical history.   Review of Systems  Constitutional: Positive for malaise/fatigue. Negative for chills and fever.  HENT: Negative for congestion, ear pain, sinus pain and sore throat.   Eyes: Negative for blurred vision, double vision, discharge and redness.  Respiratory: Positive for shortness of breath and wheezing. Negative for cough and hemoptysis.   Cardiovascular: Negative for chest pain.  Gastrointestinal: Negative for abdominal pain, blood in stool, constipation, diarrhea, nausea and vomiting.  Genitourinary: Negative for dysuria, flank pain, frequency, hematuria and urgency.  Musculoskeletal: Negative for myalgias.  Skin: Positive for itching and rash.  Neurological: Negative for dizziness, weakness and headaches.   Psychiatric/Behavioral: Negative for depression and substance abuse.    Objective:   Vitals: BP 126/85 (BP Location: Left Arm)   Pulse (!) 102   Temp 99.5 F (37.5 C) (Oral)   Resp 17   SpO2 97%   Physical Exam Constitutional:      General: He is not in acute distress.    Appearance: Normal appearance. He is well-developed. He is not ill-appearing, toxic-appearing or diaphoretic.  HENT:     Head: Normocephalic and atraumatic.     Right Ear: External ear normal.     Left Ear: External ear normal.     Nose: Nose normal.     Mouth/Throat:     Mouth: Mucous membranes are moist.     Pharynx: Oropharynx is clear.  Eyes:     General: No scleral icterus.    Extraocular Movements: Extraocular movements intact.     Pupils: Pupils are equal, round, and reactive to light.  Cardiovascular:     Rate and Rhythm: Normal rate and regular rhythm.     Heart sounds: Normal heart sounds. No murmur. No friction rub. No gallop.   Pulmonary:     Effort: Pulmonary effort is normal. No respiratory distress.     Breath sounds: Normal breath sounds. No stridor. No wheezing, rhonchi or rales.  Skin:    General: Skin is warm and dry.     Findings: Erythema and rash (Urticarial type patches diffusely scattered over lower extremities with 3 hyperpigmented lesions as depicted; lesions are not papular and appear to be resolving abrasions) present.  Neurological:  Mental Status: He is alert and oriented to person, place, and time.  Psychiatric:        Mood and Affect: Mood normal.        Behavior: Behavior normal.        Thought Content: Thought content normal.        Judgment: Judgment normal.        Assessment and Plan :   1. Shortness of breath   2. Wheezing   3. Contact dermatitis, unspecified contact dermatitis type, unspecified trigger   4. Mild persistent asthma with exacerbation     He filled his albuterol inhaler, will use prednisone course to help with what I suspect is contact  dermatitis.  Counseled on differential regarding skin lesion but low suspicion for melanoma given timing and close association with his contact dermatitis.  Counseled that he will need dermatology consult, biopsy if his symptoms persist.  We will have patient start Singulair, Zyrtec as well. Counseled patient on potential for adverse effects with medications prescribed/recommended today, ER and return-to-clinic precautions discussed, patient verbalized understanding.    Wallis BambergMani, Isador Castille, [place] JerseyPA-C 07/04/19 [phone]

## 2020-09-25 IMAGING — CR DG FINGER INDEX 2+V*L*
3 series · 3 of 3 positions shown · non-contrast
Comparison: None.

CLINICAL DATA: Laceration to left index finger.

EXAM:
LEFT INDEX FINGER 2+V

[x finger pa left]
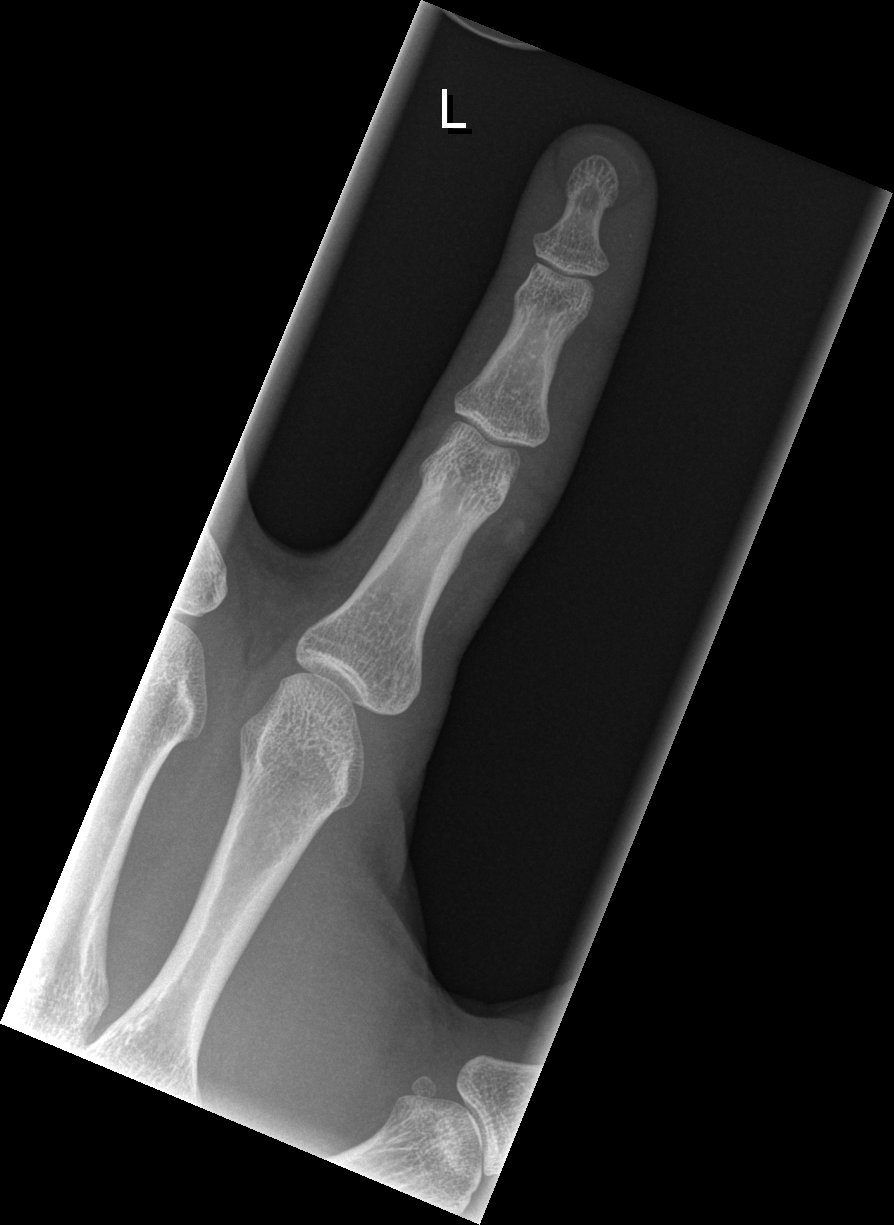

[x finger obl. left]
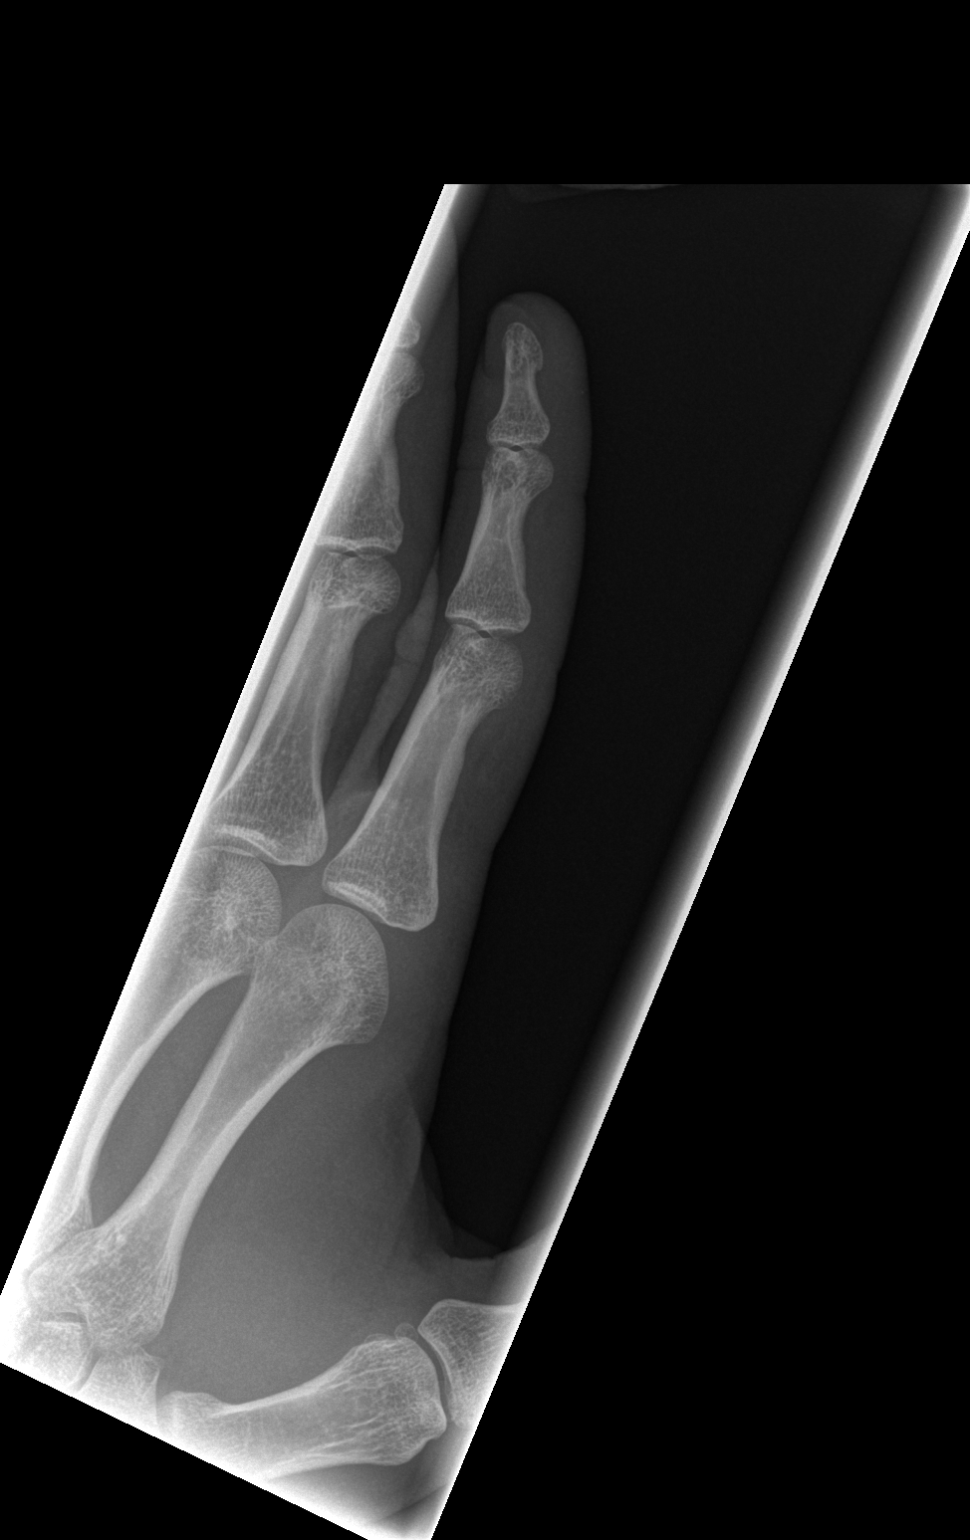

[x finger lateral left]
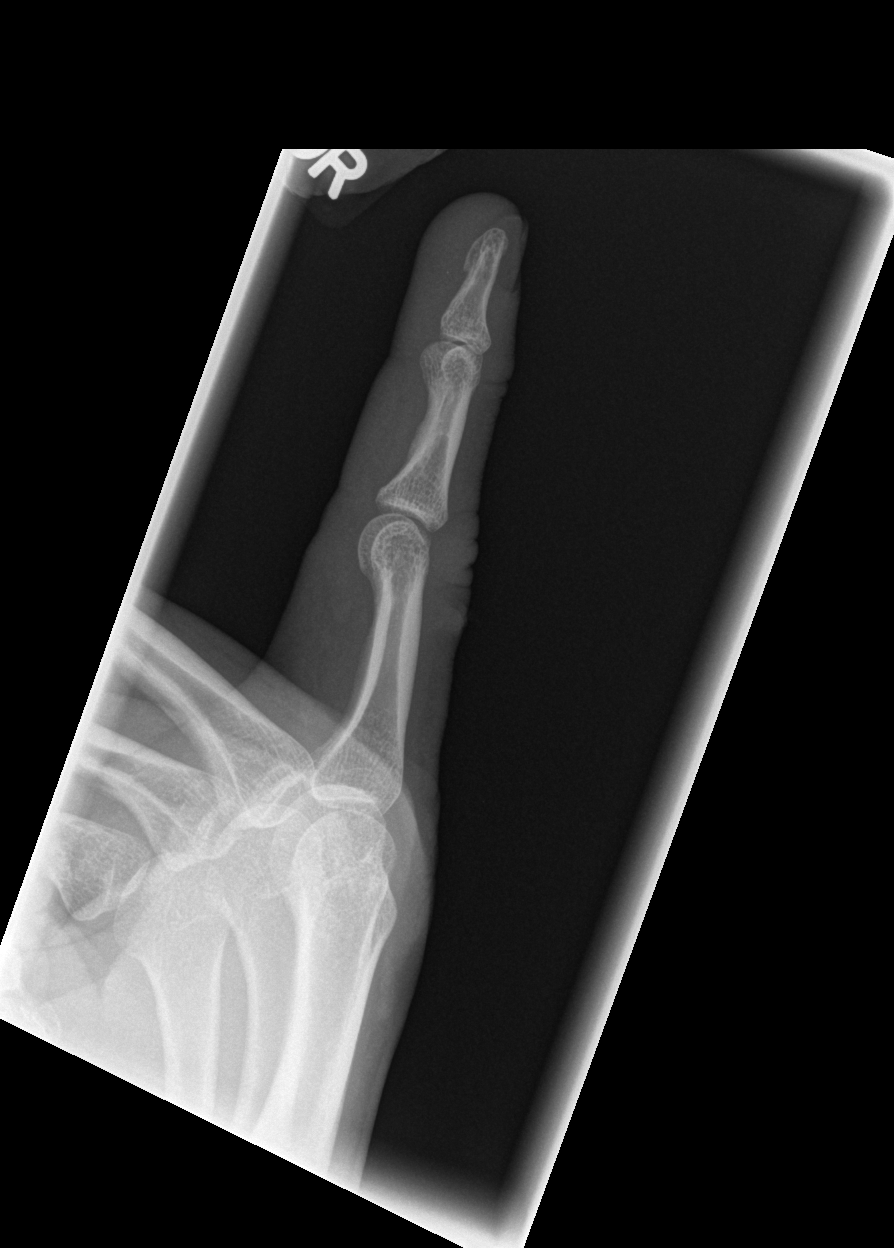

[3 of 3 positions shown; findings below may reference images not displayed]

FINDINGS: No discrete radiopaque foreign body noted. No bony abnormalities. No
fractures.
IMPRESSION: Negative.

## 2023-01-04 ENCOUNTER — Encounter: Payer: Self-pay | Admitting: Family

## 2023-01-04 ENCOUNTER — Ambulatory Visit: Payer: 59 | Admitting: Family

## 2023-01-04 VITALS — BP 122/78 | HR 83 | Temp 98.3°F | Ht 68.0 in | Wt 172.0 lb

## 2023-01-04 DIAGNOSIS — G4719 Other hypersomnia: Secondary | ICD-10-CM | POA: Diagnosis not present

## 2023-01-04 DIAGNOSIS — J3089 Other allergic rhinitis: Secondary | ICD-10-CM | POA: Diagnosis not present

## 2023-01-04 DIAGNOSIS — J4520 Mild intermittent asthma, uncomplicated: Secondary | ICD-10-CM

## 2023-01-04 DIAGNOSIS — Z13220 Encounter for screening for lipoid disorders: Secondary | ICD-10-CM

## 2023-01-04 DIAGNOSIS — J0110 Acute frontal sinusitis, unspecified: Secondary | ICD-10-CM

## 2023-01-04 DIAGNOSIS — N529 Male erectile dysfunction, unspecified: Secondary | ICD-10-CM | POA: Diagnosis not present

## 2023-01-04 DIAGNOSIS — K219 Gastro-esophageal reflux disease without esophagitis: Secondary | ICD-10-CM

## 2023-01-04 LAB — CBC
HCT: 47.2 % (ref 39.0–52.0)
Hemoglobin: 16.2 g/dL (ref 13.0–17.0)
MCHC: 34.3 g/dL (ref 30.0–36.0)
MCV: 90.4 fl (ref 78.0–100.0)
Platelets: 208 10*3/uL (ref 150.0–400.0)
RBC: 5.23 Mil/uL (ref 4.22–5.81)
RDW: 13.3 % (ref 11.5–15.5)
WBC: 7.4 10*3/uL (ref 4.0–10.5)

## 2023-01-04 LAB — BASIC METABOLIC PANEL
BUN: 18 mg/dL (ref 6–23)
CO2: 30 mEq/L (ref 19–32)
Calcium: 10.7 mg/dL — ABNORMAL HIGH (ref 8.4–10.5)
Chloride: 102 mEq/L (ref 96–112)
Creatinine, Ser: 0.86 mg/dL (ref 0.40–1.50)
GFR: 115.26 mL/min (ref >60.00)
Glucose, Bld: 86 mg/dL (ref 70–99)
Potassium: 4.8 mEq/L (ref 3.5–5.1)
Sodium: 140 mEq/L (ref 135–145)

## 2023-01-04 LAB — LIPID PANEL
Cholesterol: 154 mg/dL (ref 0–200)
HDL: 37.9 mg/dL — ABNORMAL LOW (ref >39.00)
LDL Cholesterol: 101 mg/dL — ABNORMAL HIGH (ref 0–99)
NonHDL: 116.48
Total CHOL/HDL Ratio: 4
Triglycerides: 75 mg/dL (ref 0.0–149.0)
VLDL: 15 mg/dL (ref 0.0–40.0)

## 2023-01-04 LAB — TESTOSTERONE: Testosterone: 268.95 ng/dL — ABNORMAL LOW (ref 300.00–890.00)

## 2023-01-04 LAB — TSH: TSH: 1.49 u[IU]/mL (ref 0.35–5.50)

## 2023-01-04 LAB — VITAMIN B12: Vitamin B-12: 310 pg/mL (ref 211–911)

## 2023-01-04 MED ORDER — ALBUTEROL SULFATE HFA 108 (90 BASE) MCG/ACT IN AERS: 1.0000 | INHALATION_SPRAY | Freq: Four times a day (QID) | RESPIRATORY_TRACT | 0 refills | Status: DC | PRN

## 2023-01-04 MED ORDER — MONTELUKAST SODIUM 10 MG PO TABS: 10.0000 mg | ORAL_TABLET | Freq: Every day | ORAL | 3 refills | Status: DC

## 2023-01-04 MED ORDER — CETIRIZINE HCL 10 MG PO TABS: 10.0000 mg | ORAL_TABLET | Freq: Every day | ORAL | 3 refills | Status: DC

## 2023-01-04 MED ORDER — AMOXICILLIN-POT CLAVULANATE 875-125 MG PO TABS: 1.0000 | ORAL_TABLET | Freq: Two times a day (BID) | ORAL | 0 refills | Status: DC

## 2023-01-04 NOTE — Progress Notes (Unsigned)
New Patient Office Visit  Subjective:  Patient ID: Logan Gardner, male    DOB: 12-Jun-1991  Age: 32 y.o. MRN: [id number]  CC: No chief complaint on file.   HPI [name] is here to establish care as a new patient.  Oriented to practice routines and expectations.  Prior provider was: [name], FNP   Pt is with acute concerns.  1.5 weeks ago started with sinus pressure sinus congestion, nasal congestion . No sore throat no ear pain. No fever or chills. No cough or chest congestion.    chronic concerns:  Heartburn: has since improved with diet changes.   Asthma, controlled. Albuterol only needed about twice a month at most. Aggravated at times by allergies. Also on montelukast which helped.   Has had some problems with ED , starting about two months ago. Trouble with maintaining and keeping an erection. Hard to get an orgasm at times. Emotionally well with wife per pt. Does feel he has a lot of increased stress and anxiety over the last few months and has been working more than usual.   No known h/o STD   Has been doing better with this in the last few weeks, and stress has improved  In the last few weeks.      ROS: Negative unless specifically indicated above in HPI.   Current Outpatient Medications:    amoxicillin-clavulanate (AUGMENTIN) 875-125 MG tablet, Take 1 tablet by mouth 2 (two) times daily., Disp: 20 tablet, Rfl: 0   albuterol (VENTOLIN HFA) 108 (90 Base) MCG/ACT inhaler, Inhale 1-2 puffs into the lungs every 6 (six) hours as needed for wheezing or shortness of breath., Disp: 18 g, Rfl: 0   cetirizine (ZYRTEC ALLERGY) 10 MG tablet, Take 1 tablet (10 mg total) by mouth daily., Disp: 90 tablet, Rfl: 3   montelukast (SINGULAIR) 10 MG tablet, Take 1 tablet (10 mg total) by mouth at bedtime., Disp: 90 tablet, Rfl: 3 Past Medical History:  Diagnosis Date   Asthma    Chicken pox    GERD (gastroesophageal reflux disease)    Past Surgical History:   Procedure Laterality Date   NO PAST SURGERIES      Objective:   Today's Vitals: BP 122/78   Pulse 83   Temp 98.3 F (36.8 C) (Temporal)   Ht '5\' 8"'$  (1.727 m)   Wt 172 lb (78 kg)   SpO2 99%   BMI 26.15 kg/m   Physical Exam Constitutional:      General: He is not in acute distress.    Appearance: Normal appearance. He is not ill-appearing, toxic-appearing or diaphoretic.  Cardiovascular:     Rate and Rhythm: Normal rate.  Pulmonary:     Effort: Pulmonary effort is normal.  Musculoskeletal:        General: Normal range of motion.  Neurological:     General: No focal deficit present.     Mental Status: He is alert and oriented to person, place, and time. Mental status is at baseline.  Psychiatric:        Mood and Affect: Mood normal.        Behavior: Behavior normal.        Thought Content: Thought content normal.        Judgment: Judgment normal.     Assessment & Plan:  Mild intermittent asthma without complication Assessment & Plan: Stable Refill albuterol and montelukast   Orders: -     Albuterol Sulfate HFA; Inhale 1-2 puffs into the  lungs every 6 (six) hours as needed for wheezing or shortness of breath.  Dispense: 18 g; Refill: 0 -     Montelukast Sodium; Take 1 tablet (10 mg total) by mouth at bedtime.  Dispense: 90 tablet; Refill: 3  Non-seasonal allergic rhinitis, unspecified trigger -     Cetirizine HCl; Take 1 tablet (10 mg total) by mouth daily.  Dispense: 90 tablet; Refill: 3 -     Montelukast Sodium; Take 1 tablet (10 mg total) by mouth at bedtime.  Dispense: 90 tablet; Refill: 3  Erectile dysfunction, unspecified erectile dysfunction type Assessment & Plan: New finding Ordering testosterone pending results.  Consider referral urology  Orders: -     Testosterone  Excessive daytime sleepiness -     Ambulatory referral to Pulmonology -     CBC -     Basic metabolic panel -     TSH -     Vitamin B12  Screening for lipoid disorders -      Lipid panel  Acute non-recurrent frontal sinusitis Assessment & Plan: Prescription given for augmentin 875/125 mg po bid for ten days. Pt to continue tylenol/ibuprofen prn sinus pain. Continue with humidifier prn and steam showers recommended as well. instructed If no symptom improvement in 48 hours please f/u   Orders: -     Amoxicillin-Pot Clavulanate; Take 1 tablet by mouth 2 (two) times daily.  Dispense: 20 tablet; Refill: 0  Gastroesophageal reflux disease, unspecified whether esophagitis present Assessment & Plan: Stable  Try to decrease and or avoid spicy foods, fried fatty foods, and also caffeine and chocolate as these can increase heartburn symptoms.       Follow-up: Return in about 3 months (around 04/04/2023) for f/u anxiety.   Eugenia Pancoast, FNP

## 2023-01-04 NOTE — Patient Instructions (Signed)
  A referral was placed today for possible study for sleep apnea.  Please let us know if you have not heard back within 2 weeks about the referral.

## 2023-01-05 ENCOUNTER — Other Ambulatory Visit: Payer: Self-pay | Admitting: Family

## 2023-01-05 DIAGNOSIS — N521 Erectile dysfunction due to diseases classified elsewhere: Secondary | ICD-10-CM

## 2023-01-05 DIAGNOSIS — R6882 Decreased libido: Secondary | ICD-10-CM

## 2023-01-05 DIAGNOSIS — J0110 Acute frontal sinusitis, unspecified: Secondary | ICD-10-CM | POA: Insufficient documentation

## 2023-01-05 NOTE — Progress Notes (Signed)
Testosterone is on the lower end of normal. Along with erectile dysfunction I will refer you to urology for evaluation.   Calcium slightly higher than normal.  This is probably due to low vitamin D recommend starting daily vitamin D at 1000 mcg once daily over the counter.  Cholesterol only very mildly elevated not concerning. Work on low cholesterol diet.  Thyroid normal range.  B12 on lower end, we like >400. Recommend also daily 500 mcg b12

## 2023-01-05 NOTE — Assessment & Plan Note (Signed)
New finding Ordering testosterone pending results.  Consider referral urology

## 2023-01-05 NOTE — Assessment & Plan Note (Signed)
Prescription given for augmentin 875/125 mg po bid for ten days. Pt to continue tylenol/ibuprofen prn sinus pain. Continue with humidifier prn and steam showers recommended as well. instructed If no symptom improvement in 48 hours please f/u ? ?

## 2023-01-05 NOTE — Assessment & Plan Note (Signed)
Stable Refill albuterol and montelukast

## 2023-01-05 NOTE — Assessment & Plan Note (Signed)
Stable  Try to decrease and or avoid spicy foods, fried fatty foods, and also caffeine and chocolate as these can increase heartburn symptoms.

## 2023-01-11 ENCOUNTER — Encounter: Payer: Self-pay | Admitting: Urology

## 2023-01-11 ENCOUNTER — Ambulatory Visit (INDEPENDENT_AMBULATORY_CARE_PROVIDER_SITE_OTHER): Payer: 59 | Admitting: Urology

## 2023-01-11 VITALS — BP 110/70 | HR 74 | Ht 69.0 in | Wt 177.4 lb

## 2023-01-11 DIAGNOSIS — N529 Male erectile dysfunction, unspecified: Secondary | ICD-10-CM | POA: Diagnosis not present

## 2023-01-11 DIAGNOSIS — R7989 Other specified abnormal findings of blood chemistry: Secondary | ICD-10-CM

## 2023-01-11 MED ORDER — TADALAFIL 5 MG PO TABS: 5.0000 mg | ORAL_TABLET | Freq: Every day | ORAL | 11 refills | Status: DC

## 2023-01-11 NOTE — Patient Instructions (Signed)
The Benefits of a Plant-Based Diet for Urology Health  A plant-based diet emphasizes the consumption of whole, unprocessed plant foods while minimizing or excluding animal products including meat and dairy products. This dietary approach has gained attention for its potential to promote overall health, including urology-related conditions. Incorporating a plant-based diet into your lifestyle can offer numerous benefits for maintaining optimal urology health.  1. Reduced Risk of Kidney Stones: A plant-based diet is typically rich in fruits, vegetables, legumes, and whole grains. These foods are high in dietary fiber, potassium, and magnesium, which can help reduce the risk of developing kidney stones. Be careful to avoid high quantities of spinach, as these can contribute to kidney stone formation if eaten in large volumes. The increased intake of water-soluble fiber can enhance the excretion of waste products and prevent the crystallization of minerals that lead to stone formation.  2. Improved Prostate Health: Studies have suggested a link between the consumption of red and processed meats and an increased risk of prostate problems, including benign prostatic hyperplasia (BPH) and prostate cancer. By adopting a plant-based diet, you can lower your intake of saturated fats and decrease the risk of these conditions. PSA levels can often decrease on plant based diets! Plant foods are also rich in antioxidants and phytochemicals that have been associated with prostate health.  3. Better Bladder Function: A diet focused on plant-based foods can contribute to better bladder health by reducing the risk of urinary tract infections (UTIs). Berries, citrus fruits, and leafy greens are known for their high vitamin C content, which can acidify urine and create an environment less favorable for bacteria growth. Additionally, plant-based diets are generally lower in sodium, which can help prevent fluid retention and  reduce the strain on the bladder.  4. Management of Erectile Dysfunction (ED): Some research suggests that a plant-based diet can positively impact erectile function. Plant-based diets are associated with improved cardiovascular health, which is crucial for maintaining healthy blood flow and nerve function required for proper erectile function. By reducing the consumption of high-cholesterol and high-saturated fat animal products, a plant-based diet may contribute to a decreased risk of ED.  5. Prevention of Chronic Conditions: A plant-based diet can help prevent or manage chronic conditions such as obesity, diabetes, and hypertension. These conditions can contribute to urology-related issues, including urinary incontinence and kidney dysfunction. By maintaining a healthy weight and managing these conditions, you can reduce the risk of urology-related complications.  Conclusion: Embracing a plant-based diet can offer significant benefits for urology health. By incorporating a variety of colorful fruits, vegetables, whole grains, nuts, seeds, and legumes into your meals, you can support kidney health, prostate health, bladder function, and overall well-being. Remember to consult with a healthcare professional or registered dietitian before making any significant dietary changes, especially if you have existing health conditions. Your personalized approach to a plant-based diet can contribute to improved urology health and enhance your quality of life.      Erectile Dysfunction Erectile dysfunction (ED) is the inability to get or keep an erection in order to have sexual intercourse. ED is considered a symptom of an underlying disorder and is not considered a disease. ED may include: Inability to get an erection. Lack of enough hardness of the erection to allow penetration. Loss of erection before sex is finished. What are the causes? This condition may be caused by: Physical causes, such  as: Artery problems. This may include heart disease, high blood pressure, atherosclerosis, and diabetes. Hormonal problems, such as  low testosterone. Obesity. Nerve problems. This may include back or pelvic injuries, multiple sclerosis, Parkinson's disease, spinal cord injury, and stroke. Certain medicines, such as: Pain relievers. Antidepressants. Blood pressure medicines and water pills (diuretics). Cancer medicines. Antihistamines. Muscle relaxants. Lifestyle factors, such as: Use of drugs such as marijuana, cocaine, or opioids. Excessive use of alcohol. Smoking. Lack of physical activity or exercise. Psychological causes, such as: Anxiety or stress. Sadness or depression. Exhaustion. Fear about sexual performance. Guilt. What are the signs or symptoms? Symptoms of this condition include: Inability to get an erection. Lack of enough hardness of the erection to allow penetration. Loss of the erection before sex is finished. Sometimes having normal erections, but with frequent unsatisfactory episodes. Low sexual satisfaction in either partner due to erection problems. A curved penis occurring with erection. The curve may cause pain, or the penis may be too curved to allow for intercourse. Never having nighttime or morning erections. How is this diagnosed? This condition is often diagnosed by: Performing a physical exam to find other diseases or specific problems with the penis. Asking you detailed questions about the problem. Doing tests, such as: Blood tests to check for diabetes mellitus or high cholesterol, or to measure hormone levels. Other tests to check for underlying health conditions. An ultrasound exam to check for scarring. A test to check blood flow to the penis. Doing a sleep study at home to measure nighttime erections. How is this treated? This condition may be treated by: Medicines, such as: Medicine taken by mouth to help you achieve an erection (oral  medicine). Hormone replacement therapy to replace low testosterone levels. Medicine that is injected into the penis. Your health care provider may instruct you how to give yourself these injections at home. Medicine that is delivered with a short applicator tube. The tube is inserted into the opening at the tip of the penis, which is the opening of the urethra. A tiny pellet of medicine is put in the urethra. The pellet dissolves and enhances erectile function. This is also called MUSE (medicated urethral system for erections) therapy. Vacuum pump. This is a pump with a ring on it. The pump and ring are placed on the penis and used to create pressure that helps the penis become erect. Penile implant surgery. In this procedure, you may receive: An inflatable implant. This consists of cylinders, a pump, and a reservoir. The cylinders can be inflated with a fluid that helps to create an erection, and they can be deflated after intercourse. A semi-rigid implant. This consists of two silicone rubber rods. The rods provide some rigidity. They are also flexible, so the penis can both curve downward in its normal position and become straight for sexual intercourse. Blood vessel surgery to improve blood flow to the penis. During this procedure, a blood vessel from a different part of the body is placed into the penis to allow blood to flow around (bypass) damaged or blocked blood vessels. Lifestyle changes, such as exercising more, losing weight, and quitting smoking. Follow these instructions at home: Medicines  Take over-the-counter and prescription medicines only as told by your health care provider. Do not increase the dosage without first discussing it with your health care provider. If you are using self-injections, do injections as directed by your health care provider. Make sure you avoid any veins that are on the surface of the penis. After giving an injection, apply pressure to the injection site for  5 minutes. Talk to your health care provider  about how to prevent headaches while taking ED medicines. These medicines may cause a sudden headache due to the increase in blood flow in your body. General instructions Exercise regularly, as directed by your health care provider. Work with your health care provider to lose weight, if needed. Do not use any products that contain nicotine or tobacco. These products include cigarettes, chewing tobacco, and vaping devices, such as e-cigarettes. If you need help quitting, ask your health care provider. Before using a vacuum pump, read the instructions that come with the pump and discuss any questions with your health care provider. Keep all follow-up visits. This is important. Contact a health care provider if: You feel nauseous. You are vomiting. You get sudden headaches while taking ED medicines. You have any concerns about your sexual health. Get help right away if: You are taking oral or injectable medicines and you have an erection that lasts longer than 4 hours. If your health care provider is unavailable, go to the nearest emergency room for evaluation. An erection that lasts much longer than 4 hours can result in permanent damage to your penis. You have severe pain in your groin or abdomen. You develop redness or severe swelling of your penis. You have redness spreading at your groin or lower abdomen. You are unable to urinate. You experience chest pain or a rapid heartbeat (palpitations) after taking oral medicines. These symptoms may represent a serious problem that is an emergency. Do not wait to see if the symptoms will go away. Get medical help right away. Call your local emergency services (911 in the [place] Do not drive yourself to the hospital. Summary Erectile dysfunction (ED) is the inability to get or keep an erection during sexual intercourse. This condition is diagnosed based on a physical exam, your symptoms, and tests to  determine the cause. Treatment varies depending on the cause and may include medicines, hormone therapy, surgery, or a vacuum pump. You may need follow-up visits to make sure that you are using your medicines or devices correctly. Get help right away if you are taking or injecting medicines and you have an erection that lasts longer than 4 hours. This information is not intended to replace advice given to you by your health care provider. Make sure you discuss any questions you have with your health care provider. Document Revised: 01/20/2021 Document Reviewed: 01/20/2021 Elsevier Patient Education  [place].

## 2023-01-11 NOTE — Progress Notes (Signed)
   01/11/23 2:33 PM   Logan Gardner 05-Mar-1991 [id number]  CC: ED, low testosterone  HPI: I saw Logan Gardner for the above issues.  He is a healthy 32 year old male who reports about a month of some mild problems with erections, he also had a testosterone checked by PCP that was low at 269.  This was checked at 11:40 AM.  He also was found to have borderline low B12 and low vitamin D.  He reports that his problems with erections were associated with increased stress.  He has 2 children, does not think they want further biologic pregnancies.   PMH: Past Medical History:  Diagnosis Date   Asthma    Chicken pox    GERD (gastroesophageal reflux disease)     Surgical History: Past Surgical History:  Procedure Laterality Date   NO PAST SURGERIES     Family History: Family History  Problem Relation Age of Onset   Drug abuse Mother    Heart disease Father    Arthritis Maternal Grandmother    Cancer Neg Hx    Diabetes Neg Hx     Social History:  reports that he quit smoking about 10 years ago. His smoking use included cigarettes. He has quit using smokeless tobacco. He reports current alcohol use. He reports that he does not use drugs.  Physical Exam: BP 110/70 (BP Location: Left Arm, Patient Position: Sitting, Cuff Size: Normal)   Pulse 74   Ht '5\' 9"'$  (1.753 m)   Wt 177 lb 6.4 oz (80.5 kg)   BMI 26.20 kg/m    Constitutional:  Alert and oriented, No acute distress. Cardiovascular: No clubbing, cyanosis, or edema. Respiratory: Normal respiratory effort, no increased work of breathing. GI: Abdomen is soft, nontender, nondistended, no abdominal masses GU: Normal phallus, patent meatus, no lesions, testicles 20 cc and descended bilaterally  Assessment & Plan:   32 year old male here with mild ED as well as a single low testosterone value that was checked in the late morning.  We reviewed the AUA guidelines regarding evaluation and management of patients with erectile  dysfunction as well as a low testosterone level.  We focused primarily on behavioral strategies including decreasing stress, adequate sleep, healthy eating, and physical activity/exercise, specifically weight lifting regarding testosterone increase.  We reviewed the impact of exogenous testosterone on fertility as well as natural testosterone production, and I discouraged him from considering supplementation at this point.  I recommended trying some of the behavioral strategies above, and considering a repeat morning testosterone in 4 to 6 weeks.  Finally, I recommended a trial of low-dose daily Cialis for at least 1 month to see if we can improve the erections while he works on the above behavioral strategies.  Trial of Cialis Repeat morning testosterone in 4 to 6 weeks, call with results He prefers follow-up as needed  Nickolas Madrid, MD 01/11/2023  Rafael Hernandez [address] [place], [place] [address] [phone]

## 2023-01-26 ENCOUNTER — Other Ambulatory Visit: Payer: Self-pay | Admitting: Family

## 2023-01-26 DIAGNOSIS — J4520 Mild intermittent asthma, uncomplicated: Secondary | ICD-10-CM

## 2023-04-04 ENCOUNTER — Ambulatory Visit: Payer: 59 | Admitting: Family

## 2023-04-05 ENCOUNTER — Encounter: Payer: Self-pay | Admitting: Family

## 2023-06-01 ENCOUNTER — Emergency Department (HOSPITAL_BASED_OUTPATIENT_CLINIC_OR_DEPARTMENT_OTHER)
Admission: EM | Admit: 2023-06-01 | Discharge: 2023-06-01 | Disposition: A | Discharge status: Home / Self Care | Payer: 59 | Attending: Emergency Medicine | Admitting: Emergency Medicine

## 2023-06-01 ENCOUNTER — Other Ambulatory Visit: Payer: Self-pay

## 2023-06-01 ENCOUNTER — Encounter (HOSPITAL_BASED_OUTPATIENT_CLINIC_OR_DEPARTMENT_OTHER): Payer: Self-pay | Admitting: Emergency Medicine

## 2023-06-01 DIAGNOSIS — R42 Dizziness and giddiness: Secondary | ICD-10-CM | POA: Diagnosis present

## 2023-06-01 LAB — COMPREHENSIVE METABOLIC PANEL
ALT: 28 U/L (ref 0–44)
AST: 21 U/L (ref 15–41)
Albumin: 4.4 g/dL (ref 3.5–5.0)
Alkaline Phosphatase: 61 U/L (ref 38–126)
Anion gap: 6 (ref 5–15)
BUN: 15 mg/dL (ref 6–20)
CO2: 26 mmol/L (ref 22–32)
Calcium: 8.8 mg/dL — ABNORMAL LOW (ref 8.9–10.3)
Chloride: 106 mmol/L (ref 98–111)
Creatinine, Ser: 0.95 mg/dL (ref 0.61–1.24)
GFR, Estimated: >60 mL/min (ref >60)
Glucose, Bld: 110 mg/dL — ABNORMAL HIGH (ref 70–99)
Potassium: 4.1 mmol/L (ref 3.5–5.1)
Sodium: 138 mmol/L (ref 135–145)
Total Bilirubin: 0.6 mg/dL (ref 0.3–1.2)
Total Protein: 7.2 g/dL (ref 6.5–8.1)

## 2023-06-01 LAB — CBC WITH DIFFERENTIAL/PLATELET
Abs Immature Granulocytes: 0.02 10*3/uL (ref 0.00–0.07)
Basophils Absolute: 0.1 10*3/uL (ref 0.0–0.1)
Basophils Relative: 1 %
Eosinophils Absolute: 0.1 10*3/uL (ref 0.0–0.5)
Eosinophils Relative: 2 %
HCT: 44 % (ref 39.0–52.0)
Hemoglobin: 14.9 g/dL (ref 13.0–17.0)
Immature Granulocytes: 0 %
Lymphocytes Relative: 31 %
Lymphs Abs: 2.3 10*3/uL (ref 0.7–4.0)
MCH: 30.8 pg (ref 26.0–34.0)
MCHC: 33.9 g/dL (ref 30.0–36.0)
MCV: 90.9 fL (ref 80.0–100.0)
Monocytes Absolute: 0.6 10*3/uL (ref 0.1–1.0)
Monocytes Relative: 8 %
Neutro Abs: 4.3 10*3/uL (ref 1.7–7.7)
Neutrophils Relative %: 58 %
Platelets: 204 10*3/uL (ref 150–400)
RBC: 4.84 MIL/uL (ref 4.22–5.81)
RDW: 12.7 % (ref 11.5–15.5)
WBC: 7.4 10*3/uL (ref 4.0–10.5)
nRBC: 0 % (ref 0.0–0.2)

## 2023-06-01 MED ORDER — LACTATED RINGERS IV BOLUS
1000.0000 mL | Freq: Once | INTRAVENOUS | Status: AC
  Administered 2023-06-01: 1000 mL via INTRAVENOUS

## 2023-06-01 NOTE — ED Notes (Signed)
Urine on hold in lab

## 2023-06-01 NOTE — ED Triage Notes (Signed)
Patient arrives ambulatory by POV coming from work. 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 States he was light headed and feeling off. Went to [employer] [facility] and had vitals checked and recommended patient come here to be evaluated.

## 2023-06-01 NOTE — ED Provider Notes (Signed)
[place] [facility] Provider Note   CSN: [id number] Arrival date & time: 06/01/23  1319     History Chief Complaint  Patient presents with   Dizziness     Dizziness  Logan Gardner is a 32 y.o. male presenting for dizziness.  Patient arrives ambulatory by POV coming from work. 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  States he was light headed and feeling off. Went to [facility] and had vitals checked and recommended patient come here to be evaluated.   Here in the ED, he is still reporting mild dizziness and lightheaded. He denies loss of consciousness.  Also denies fevers, chest pain, or shortness of breath.  Patient's recorded medical, surgical, social, medication list and allergies were reviewed in the Snapshot window as part of the initial history.   Review of Systems   Review of Systems  Neurological:  Positive for dizziness.    Physical Exam Updated Vital Signs BP 135/78 (BP Location: Left Arm)   Pulse 90   Temp 98.4 F (36.9 C) (Oral)   Resp 16   Ht 5\' 9"  (1.753 m)   Wt 79.4 kg   SpO2 100%   BMI 25.84 kg/m  Physical Exam General: Pleasant, not in acute distress.   CV: RRR. No murmurs, rubs, or gallops. No LE edema Pulmonary: Lungs CTAB. Normal effort. No wheezing or rales. Abdominal: Soft, nontender, nondistended. Normal bowel sounds. Extremities: Palpable pulses. Normal ROM. Skin: Warm and dry. No obvious rash or lesions. Neuro: A&Ox3. Moves all extremities. Normal sensation. No focal deficit. Psych: Normal mood and affect   ED Course/ Medical Decision Making/ A&P   Procedures Procedures   Medications Ordered in ED Medications  lactated ringers bolus 1,000 mL (1,000 mLs Intravenous New Bag/Given 06/01/23 1430)    Medical Decision Making:    Logan Gardner is a 32 y.o. male who presented to the ED today with  dizziness detailed above.     Patient placed on continuous vitals and telemetry monitoring while in ED which was reviewed periodically.   Complete initial physical exam performed, notably the patient was stable. No focal neurologic deficit on exam. Lungs are clear bilaterally. Reviewed and confirmed nursing documentation for past medical history, family history, social history.    Initial Assessment:   With the patient's presentation of dizziness, differential diagnosis includes anemia versus cardiac etiology versus metabolic derangement vs stroke.  Patient's age, lack of focal deficit on neuroexam makes stroke highly unlikely.  It is also possible that patient might have accidentally inhaled some drugs. Patient will need an EKG to exclude a cardiac etiology and CBC to rule out anemia.    This is most consistent with an acute complicated illness  Initial Plan:  IV fluids Screening labs including CBC and Metabolic panel to evaluate for infectious or metabolic etiology of disease.  Urine drug screen EKG to evaluate for cardiac pathology. Objective evaluation as below reviewed with plan for close reassessment  Initial Study Results:   Laboratory  All laboratory results reviewed without evidence of clinically relevant pathology.     Reassessment and Plan:   Both CBC and CMP unremarkable. EKG without ischemic changes. Unclear the etiology of the dizziness, however patient is stable.  .   Clinical Impression:  1. Dizziness      Data Unavailable   Final Clinical Impression(s) / ED Diagnoses  Final diagnoses:  Dizziness    Rx / DC Orders ED Discharge Orders     None         Laretta Bolster, MD 06/02/23 4098    Alvira Monday, MD 06/04/23 [phone]

## 2023-06-01 NOTE — ED Notes (Signed)
Pt unable to give urine sample. Aware that a specimen is needed.

## 2023-06-02 ENCOUNTER — Telehealth: Payer: Self-pay

## 2023-06-02 NOTE — Telephone Encounter (Signed)
Noted  

## 2023-06-02 NOTE — Transitions of Care (Post Inpatient/ED Visit) (Signed)
I spoke with pt; pt was seen at [facility] ED on 06/01/23.pt felt tired and heart racing and dizziness. 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 pt left but after left residence went ot ED for eval. Pt said today he feels fine just a little tired and pt does not think necessary to FU with PCP at this time.pt will call [facility] if condition changes or worsens,sending note to Hayden Pedro FNP who is out of office until 06/06/23 and Audria Nine NP who is in office.       06/02/2023  Name: Logan Gardner MRN: [id number] DOB: [birth year]  Today's TOC FU Call Status: Today's TOC FU Call Status:: Successful TOC FU Call Competed TOC FU Call Complete Date: 06/02/23  Transition Care Management Follow-up Telephone Call Date of Discharge: 06/01/23 Discharge Facility: Other ([facility]) Name of Other ([facility]) Discharge Facility: [facility] ED Type of Discharge: Emergency Department (pt seen [facility] ED pt felt tired and heart racing and dizziness. 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 pt left but after left residence went ot ED for eval.) How have you been since you were released from the hospital?: Better (pt feels fine today; just little tired.) Any questions or concerns?: No  Items Reviewed: Did you receive and understand the discharge instructions provided?: Yes Medications obtained,verified, and reconciled?: Yes (Medications Reviewed) Any new allergies since your discharge?: No Dietary orders reviewed?: NA Do you have support at home?: Yes People in Home: spouse Name of Support/Comfort Primary Source: Lanora Manis  Medications Reviewed Today: Medications Reviewed Today   Medications were not reviewed in this encounter     Home Care and Equipment/Supplies: Were Home Health Services Ordered?: NA Any new equipment or medical supplies ordered?: NA  Functional Questionnaire: Do  you need assistance with bathing/showering or dressing?: No Do you need assistance with meal preparation?: No Do you need assistance with eating?: No Do you have difficulty maintaining continence: No Do you need assistance with getting out of bed/getting out of a chair/moving?: No Do you have difficulty managing or taking your medications?: No  Follow up appointments reviewed: PCP Follow-up appointment confirmed?: No (pt said he thinks he is fne and does not want to schedule FU appt at this time. pt will call [facility] for appt with PCP if pt thinksneeded.) MD Provider Line [phone] Given: Yes Specialist Hospital Follow-up appointment confirmed?: NA Do you need transportation to your follow-up appointment?: No Do you understand care options if your condition(s) worsen?: Yes-patient verbalized understanding    SIGNATURE Lewanda Rife, LPN

## 2023-06-02 NOTE — Telephone Encounter (Signed)
Thank you for taking the call.  Noted.

## 2023-06-05 ENCOUNTER — Telehealth: Payer: Self-pay

## 2023-06-05 NOTE — Transitions of Care (Post Inpatient/ED Visit) (Unsigned)
   06/05/2023  Name: Logan Gardner MRN: [id number] DOB: [birth year]  Today's TOC FU Call Status: Today's TOC FU Call Status:: Unsuccessul Call (1st Attempt) Unsuccessful Call (1st Attempt) Date: 06/05/23  Attempted to reach the patient regarding the most recent Inpatient/ED visit.  Follow Up Plan: Additional outreach attempts will be made to reach the patient to complete the Transitions of Care (Post Inpatient/ED visit) call.   Signature   Woodfin Ganja LPN [facility] Nurse Health Advisor Direct Dial [phone]

## 2023-06-06 NOTE — Transitions of Care (Post Inpatient/ED Visit) (Signed)
   06/06/2023  Name: Logan Gardner MRN: [id number] DOB: 1990-12-07  Today's TOC FU Call Status: Today's TOC FU Call Status:: Successful TOC FU Call Competed Unsuccessful Call (1st Attempt) Date: 06/05/23 [facility] FU Call Complete Date: 06/06/23  Transition Care Management Follow-up Telephone Call Date of Discharge: 06/01/23 Discharge Facility: [facility] Type of Discharge: Emergency Department Reason for ED Visit: Other: (dizziness) How have you been since you were released from the hospital?: Better Any questions or concerns?: No  Items Reviewed: Did you receive and understand the discharge instructions provided?: Yes Medications obtained,verified, and reconciled?: Yes (Medications Reviewed) Any new allergies since your discharge?: No Dietary orders reviewed?: Yes Do you have support at home?: Yes  Medications Reviewed Today: Medications Reviewed Today     Reviewed by Merleen Nicely, LPN (Licensed Practical Nurse) on 06/06/23 at 1024  Med List Status: <None>   Medication Order Taking? Sig Documenting Provider Last Dose Status Informant  albuterol (VENTOLIN HFA) 108 (90 Base) MCG/ACT inhaler [id number] Yes INHALE 1 TO 2 PUFFS INTO THE LUNGS EVERY 6 HOURS AS NEEDED FOR WHEEZING OR SHORTNESS OF Alda Lea, Tabitha, FNP Taking Active   amoxicillin-clavulanate (AUGMENTIN) 875-125 MG tablet [id number] Yes Take 1 tablet by mouth 2 (two) times daily. Mort Sawyers, FNP Taking Active   cetirizine (ZYRTEC ALLERGY) 10 MG tablet [id number] Yes Take 1 tablet (10 mg total) by mouth daily. Mort Sawyers, FNP Taking Active   montelukast (SINGULAIR) 10 MG tablet [id number] Yes Take 1 tablet (10 mg total) by mouth at bedtime. Mort Sawyers, FNP Taking Active   tadalafil (CIALIS) 5 MG tablet [id number] Yes Take 1 tablet (5 mg total) by mouth daily. Sondra Come, MD Taking Active             Home Care and Equipment/Supplies: Were Home Health Services Ordered?: NA Any new  equipment or medical supplies ordered?: NA  Functional Questionnaire: Do you need assistance with bathing/showering or dressing?: No Do you need assistance with meal preparation?: No Do you need assistance with eating?: No Do you have difficulty maintaining continence: No Do you need assistance with getting out of bed/getting out of a chair/moving?: No Do you have difficulty managing or taking your medications?: No  Follow up appointments reviewed: PCP Follow-up appointment confirmed?: Yes MD Provider Line Number:[phone] Given: Yes Date of PCP follow-up appointment?: 06/13/23 Follow-up Provider: Mort Sawyers FNP Specialist Hospital Follow-up appointment confirmed?: NA Do you need transportation to your follow-up appointment?: No Do you understand care options if your condition(s) worsen?: Yes-patient verbalized understanding    SIGNATURE  Woodfin Ganja LPN [facility] Nurse Health Advisor Direct Dial [phone]

## 2023-06-13 ENCOUNTER — Ambulatory Visit: Payer: 59 | Admitting: Family

## 2023-06-13 VITALS — BP 108/68 | HR 65 | Temp 98.1°F | Ht 69.0 in | Wt 180.4 lb

## 2023-06-13 DIAGNOSIS — Z79899 Other long term (current) drug therapy: Secondary | ICD-10-CM | POA: Diagnosis not present

## 2023-06-13 DIAGNOSIS — G4719 Other hypersomnia: Secondary | ICD-10-CM | POA: Diagnosis not present

## 2023-06-13 DIAGNOSIS — F902 Attention-deficit hyperactivity disorder, combined type: Secondary | ICD-10-CM | POA: Diagnosis not present

## 2023-06-13 MED ORDER — AMPHETAMINE-DEXTROAMPHET ER 10 MG PO CP24: 10.0000 mg | ORAL_CAPSULE | Freq: Every day | ORAL | 0 refills | Status: AC

## 2023-06-13 NOTE — Assessment & Plan Note (Signed)
Pt to talk over with his wife and will consider sleep study. Will order if he decides to proceed.

## 2023-06-13 NOTE — Progress Notes (Signed)
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     Problems focus    Pt is having difficulty focusing and reading. 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      HPI: Logan Gardner is a 32 y.o. 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  ) and Problems focus (Pt is having difficulty focusing and reading. 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  ) . 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 CBC CMP unremarkable.  EKG with NSR  Pt states today no longer with any symptoms. He did have a coworker as well go to the ER with him also.   New complaints: He has struggled with lack of focus and concentration for some time. 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  He is having a horrible time keeping focused with reading and feels like he'll read something and not 'take it in'. In school did have some learning difficulties and he states most of the time it was problem comprehending what he was taking in. Often drifting off in his mind while reading as well having to pull himself back in. Easy to think about multiple tasks when he should be focusing. He has taken his test multiple times and failing. As a child was diagnosed with ADHD. He was placed on ritalin as a kid but it made him aggressive.   Lack of sleep, often tired throughout the day. Has not went for a sleep study.    Social history:  Relevant past medical, surgical, family and social history reviewed and updated as indicated. Interim medical history since our last visit  reviewed.  Allergies and medications reviewed and updated.  DATA REVIEWED: CHART IN EPIC     ROS: Negative unless specifically indicated above in HPI.    Current Outpatient Medications:    albuterol (VENTOLIN HFA) 108 (90 Base) MCG/ACT inhaler, INHALE 1 TO 2 PUFFS INTO THE LUNGS EVERY 6 HOURS AS NEEDED FOR WHEEZING OR SHORTNESS OF BREATH, Disp: 8.5 g, Rfl: 0   amphetamine-dextroamphetamine (ADDERALL XR) 10 MG 24 hr capsule, Take 1 capsule (10 mg total) by mouth daily., Disp: 30 capsule, Rfl: 0      Objective:    BP 108/68   Pulse 65   Temp 98.1 F (36.7 C) (Oral)   Ht 5\' 9"  (1.753 m)   Wt 180 lb 6.4 oz (81.8 kg)   SpO2 96%   BMI 26.64 kg/m   Wt Readings from Last 3 Encounters:  06/13/23 180 lb 6.4 oz (81.8 kg)  06/01/23 175 lb (79.4 kg)  01/11/23 177 lb 6.4 oz (80.5 kg)    Physical Exam Constitutional:      General: He is not in acute distress.    Appearance: Normal appearance. He is normal weight. He is not ill-appearing, toxic-appearing or diaphoretic.  Cardiovascular:     Rate and Rhythm: Normal rate and regular rhythm.  Pulmonary:     Effort: Pulmonary effort is  normal.  Musculoskeletal:        General: Normal range of motion.  Neurological:     General: No focal deficit present.     Mental Status: He is alert and oriented to person, place, and time. Mental status is at baseline.  Psychiatric:        Mood and Affect: Mood normal.        Behavior: Behavior normal.        Thought Content: Thought content normal.        Judgment: Judgment normal.           Assessment & Plan:  High risk medication use -     Amphetamine-Dextroamphet ER; Take 1 capsule (10 mg total) by mouth daily.  Dispense: 30 capsule; Refill: 0 -     Urine drug profile, 9 drugs (performed at [facility])  Attention deficit hyperactivity disorder (ADHD), combined type Assessment & Plan: Trial adderall 10 mg XR  Uds ordered pending PDMP reviewed.  Advised pt of s/e to expect and or let  me know about.    Orders: -     Amphetamine-Dextroamphet ER; Take 1 capsule (10 mg total) by mouth daily.  Dispense: 30 capsule; Refill: 0 -     Urine drug profile, 9 drugs (performed at [facility]  Excessive daytime sleepiness Assessment & Plan: Pt to talk over with his wife and will consider sleep study. Will order if he decides to proceed.        Return in about 3 months (around 09/13/2023) for f/u ADD medication.  Mort Sawyers, MSN, APRN, FNP-C [facility]

## 2023-06-13 NOTE — Assessment & Plan Note (Signed)
Trial adderall 10 mg XR  Uds ordered pending PDMP reviewed.  Advised pt of s/e to expect and or let me know about.

## 2024-04-18 ENCOUNTER — Encounter: Payer: Self-pay | Admitting: Family

## 2024-04-18 NOTE — Telephone Encounter (Signed)
 Please advise pt I think he needs to be seen by an eye doctor more urgently since he has newer more acute onset vision changes
